# Patient Record
Sex: Male | Born: 1945 | Race: White | Hispanic: No | Marital: Married | Smoking: Former smoker
Health system: Southern US, Community
[De-identification: ages and names within clinical notes are randomized; demographics above are authoritative.]

## PROBLEM LIST (undated history)

## (undated) DIAGNOSIS — I10 Essential (primary) hypertension: Secondary | ICD-10-CM

## (undated) DIAGNOSIS — Z5189 Encounter for other specified aftercare: Secondary | ICD-10-CM

## (undated) DIAGNOSIS — Z0100 Encounter for examination of eyes and vision without abnormal findings: Secondary | ICD-10-CM

## (undated) DIAGNOSIS — H3500 Unspecified background retinopathy: Secondary | ICD-10-CM

## (undated) DIAGNOSIS — E7800 Pure hypercholesterolemia, unspecified: Secondary | ICD-10-CM

## (undated) DIAGNOSIS — M159 Polyosteoarthritis, unspecified: Secondary | ICD-10-CM

## (undated) DIAGNOSIS — E559 Vitamin D deficiency, unspecified: Secondary | ICD-10-CM

## (undated) DIAGNOSIS — E119 Type 2 diabetes mellitus without complications: Secondary | ICD-10-CM

## (undated) DIAGNOSIS — D649 Anemia, unspecified: Secondary | ICD-10-CM

## (undated) DIAGNOSIS — N183 Chronic kidney disease, stage 3 unspecified: Secondary | ICD-10-CM

## (undated) DIAGNOSIS — T8450XA Infection and inflammatory reaction due to unspecified internal joint prosthesis, initial encounter: Secondary | ICD-10-CM

## (undated) DIAGNOSIS — E785 Hyperlipidemia, unspecified: Secondary | ICD-10-CM

## (undated) DIAGNOSIS — M25569 Pain in unspecified knee: Secondary | ICD-10-CM

## (undated) DIAGNOSIS — R011 Cardiac murmur, unspecified: Secondary | ICD-10-CM

## (undated) DIAGNOSIS — I350 Nonrheumatic aortic (valve) stenosis: Secondary | ICD-10-CM

## (undated) DIAGNOSIS — I4891 Unspecified atrial fibrillation: Secondary | ICD-10-CM

## (undated) DIAGNOSIS — I359 Nonrheumatic aortic valve disorder, unspecified: Secondary | ICD-10-CM

## (undated) DIAGNOSIS — Z96659 Presence of unspecified artificial knee joint: Secondary | ICD-10-CM

## (undated) HISTORY — DX: Anemia, unspecified: D64.9

## (undated) HISTORY — DX: Polyosteoarthritis, unspecified: M15.9

## (undated) HISTORY — DX: Unspecified background retinopathy: H35.00

## (undated) HISTORY — DX: Hyperlipidemia, unspecified: E78.5

## (undated) HISTORY — DX: Chronic kidney disease, stage 3 unspecified: N18.30

## (undated) HISTORY — DX: Pain in unspecified knee: M25.569

## (undated) HISTORY — PX: TONSILLECTOMY: SUR1361

## (undated) HISTORY — DX: Encounter for other specified aftercare: Z51.89

## (undated) HISTORY — DX: Encounter for examination of eyes and vision without abnormal findings: Z01.00

## (undated) HISTORY — DX: Pure hypercholesterolemia, unspecified: E78.00

## (undated) HISTORY — DX: Vitamin D deficiency, unspecified: E55.9

## (undated) HISTORY — DX: Unspecified atrial fibrillation: I48.91

## (undated) HISTORY — PX: DENTAL SURGERY: SHX609

## (undated) HISTORY — PX: OTHER SURGICAL HISTORY: SHX169

## (undated) HISTORY — DX: Nonrheumatic aortic valve disorder, unspecified: I35.9

## (undated) HISTORY — DX: Presence of unspecified artificial knee joint: Z96.659

## (undated) HISTORY — PX: TOTAL KNEE ARTHROPLASTY: SHX125

## (undated) HISTORY — DX: Type 2 diabetes mellitus without complications: E11.9

## (undated) HISTORY — DX: Chronic kidney disease, stage 3 (moderate): N18.3

## (undated) HISTORY — DX: Nonrheumatic aortic (valve) stenosis: I35.0

## (undated) HISTORY — DX: Essential (primary) hypertension: I10

## (undated) HISTORY — DX: Infection and inflammatory reaction due to unspecified internal joint prosthesis, initial encounter: T84.50XA

## (undated) HISTORY — DX: Cardiac murmur, unspecified: R01.1

---

## 2005-09-03 ENCOUNTER — Ambulatory Visit: Payer: Self-pay | Admitting: Family Medicine

## 2005-09-12 ENCOUNTER — Ambulatory Visit: Payer: Self-pay | Admitting: Family Medicine

## 2005-09-24 ENCOUNTER — Encounter: Admission: RE | Admit: 2005-09-24 | Discharge: 2005-12-23 | Payer: Self-pay | Admitting: Family Medicine

## 2005-12-11 ENCOUNTER — Ambulatory Visit: Payer: Self-pay | Admitting: Family Medicine

## 2005-12-25 ENCOUNTER — Ambulatory Visit: Payer: Self-pay | Admitting: Family Medicine

## 2006-07-08 ENCOUNTER — Ambulatory Visit: Payer: Self-pay | Admitting: Family Medicine

## 2006-07-17 ENCOUNTER — Ambulatory Visit: Payer: Self-pay | Admitting: Family Medicine

## 2006-10-14 ENCOUNTER — Ambulatory Visit: Payer: Self-pay | Admitting: Family Medicine

## 2006-10-14 LAB — CONVERTED CEMR LAB
Albumin: 3.9 g/dL (ref 3.5–5.2)
Alkaline Phosphatase: 53 units/L (ref 39–117)
BUN: 21 mg/dL (ref 6–23)
Basophils Absolute: 0 10*3/uL (ref 0.0–0.1)
Basophils Relative: 0.9 % (ref 0.0–1.0)
Bilirubin Urine: NEGATIVE
CO2: 29 meq/L (ref 19–32)
Chol/HDL Ratio, serum: 4.2
Cholesterol: 143 mg/dL (ref 0–200)
Creatinine, Ser: 1.1 mg/dL (ref 0.4–1.5)
Glucose, Bld: 126 mg/dL — ABNORMAL HIGH (ref 70–99)
HCT: 42.2 % (ref 39.0–52.0)
Hgb A1c MFr Bld: 6.8 % — ABNORMAL HIGH (ref 4.6–6.0)
Ketones, ur: NEGATIVE mg/dL
MCV: 85.2 fL (ref 78.0–100.0)
Monocytes Absolute: 0.6 10*3/uL (ref 0.2–0.7)
PSA: 1.38 ng/mL (ref 0.10–4.00)
Potassium: 4 meq/L (ref 3.5–5.1)
RBC: 4.95 M/uL (ref 4.22–5.81)
Sodium: 140 meq/L (ref 135–145)
TSH: 1.26 microintl units/mL (ref 0.35–5.50)
Total Protein, Urine: NEGATIVE mg/dL
Total Protein: 6.8 g/dL (ref 6.0–8.3)
Triglyceride fasting, serum: 87 mg/dL (ref 0–149)
VLDL: 17 mg/dL (ref 0–40)

## 2006-10-21 ENCOUNTER — Ambulatory Visit: Payer: Self-pay | Admitting: Family Medicine

## 2006-10-29 ENCOUNTER — Ambulatory Visit: Payer: Self-pay | Admitting: Family Medicine

## 2006-10-29 ENCOUNTER — Encounter: Payer: Self-pay | Admitting: Cardiology

## 2007-02-05 ENCOUNTER — Inpatient Hospital Stay (HOSPITAL_COMMUNITY): Admission: RE | Admit: 2007-02-05 | Discharge: 2007-02-07 | Payer: Self-pay | Admitting: Orthopedic Surgery

## 2007-07-29 ENCOUNTER — Ambulatory Visit: Payer: Self-pay | Admitting: Family Medicine

## 2007-07-29 ENCOUNTER — Ambulatory Visit: Payer: Self-pay | Admitting: Internal Medicine

## 2007-07-29 DIAGNOSIS — E119 Type 2 diabetes mellitus without complications: Secondary | ICD-10-CM | POA: Insufficient documentation

## 2007-07-29 DIAGNOSIS — M1990 Unspecified osteoarthritis, unspecified site: Secondary | ICD-10-CM | POA: Insufficient documentation

## 2007-07-29 DIAGNOSIS — M25569 Pain in unspecified knee: Secondary | ICD-10-CM | POA: Insufficient documentation

## 2007-07-29 DIAGNOSIS — J209 Acute bronchitis, unspecified: Secondary | ICD-10-CM | POA: Insufficient documentation

## 2007-07-29 DIAGNOSIS — I10 Essential (primary) hypertension: Secondary | ICD-10-CM | POA: Insufficient documentation

## 2007-07-30 ENCOUNTER — Inpatient Hospital Stay (HOSPITAL_COMMUNITY): Admission: RE | Admit: 2007-07-30 | Discharge: 2007-08-04 | Payer: Self-pay | Admitting: Orthopedic Surgery

## 2007-07-31 ENCOUNTER — Encounter: Payer: Self-pay | Admitting: Internal Medicine

## 2007-08-18 ENCOUNTER — Telehealth: Payer: Self-pay | Admitting: Family Medicine

## 2007-08-26 ENCOUNTER — Encounter: Payer: Self-pay | Admitting: Internal Medicine

## 2007-08-26 DIAGNOSIS — M159 Polyosteoarthritis, unspecified: Secondary | ICD-10-CM | POA: Insufficient documentation

## 2007-08-26 DIAGNOSIS — E785 Hyperlipidemia, unspecified: Secondary | ICD-10-CM | POA: Insufficient documentation

## 2007-08-26 DIAGNOSIS — I359 Nonrheumatic aortic valve disorder, unspecified: Secondary | ICD-10-CM | POA: Insufficient documentation

## 2007-08-27 ENCOUNTER — Ambulatory Visit: Payer: Self-pay | Admitting: Internal Medicine

## 2007-08-27 DIAGNOSIS — T8450XA Infection and inflammatory reaction due to unspecified internal joint prosthesis, initial encounter: Secondary | ICD-10-CM | POA: Insufficient documentation

## 2007-10-03 ENCOUNTER — Ambulatory Visit: Payer: Self-pay | Admitting: Family Medicine

## 2007-10-14 ENCOUNTER — Ambulatory Visit: Payer: Self-pay | Admitting: Internal Medicine

## 2007-11-11 ENCOUNTER — Ambulatory Visit: Payer: Self-pay | Admitting: Family Medicine

## 2007-11-11 LAB — CONVERTED CEMR LAB
Albumin: 4.2 g/dL (ref 3.5–5.2)
Alkaline Phosphatase: 49 units/L (ref 39–117)
Basophils Relative: 0.3 % (ref 0.0–1.0)
Bilirubin, Direct: 0.1 mg/dL (ref 0.0–0.3)
CO2: 27 meq/L (ref 19–32)
Creatinine,U: 158.2 mg/dL
Eosinophils Absolute: 0.1 10*3/uL (ref 0.0–0.6)
GFR calc Af Amer: 98 mL/min
GFR calc non Af Amer: 81 mL/min
Glucose, Bld: 128 mg/dL — ABNORMAL HIGH (ref 70–99)
HDL: 32.7 mg/dL — ABNORMAL LOW (ref >39.0)
Hemoglobin: 13.2 g/dL (ref 13.0–17.0)
Lymphocytes Relative: 28.1 % (ref 12.0–46.0)
MCV: 84.3 fL (ref 78.0–100.0)
Microalb, Ur: 1 mg/dL (ref 0.0–1.9)
Monocytes Relative: 12.9 % — ABNORMAL HIGH (ref 3.0–11.0)
Neutro Abs: 2.8 10*3/uL (ref 1.4–7.7)
Neutrophils Relative %: 56.1 % (ref 43.0–77.0)
PSA: 1.71 ng/mL (ref 0.10–4.00)
Potassium: 4.6 meq/L (ref 3.5–5.1)
RDW: 15.2 % — ABNORMAL HIGH (ref 11.5–14.6)
TSH: 1.2 microintl units/mL (ref 0.35–5.50)
Total Protein: 6.9 g/dL (ref 6.0–8.3)
Triglycerides: 80 mg/dL (ref 0–149)
VLDL: 16 mg/dL (ref 0–40)

## 2007-11-18 ENCOUNTER — Ambulatory Visit: Payer: Self-pay | Admitting: Family Medicine

## 2008-01-01 ENCOUNTER — Telehealth: Payer: Self-pay | Admitting: Family Medicine

## 2008-02-24 ENCOUNTER — Inpatient Hospital Stay (HOSPITAL_COMMUNITY): Admission: RE | Admit: 2008-02-24 | Discharge: 2008-03-01 | Payer: Self-pay | Admitting: Orthopedic Surgery

## 2008-02-25 ENCOUNTER — Ambulatory Visit: Payer: Self-pay | Admitting: Infectious Diseases

## 2008-03-17 IMAGING — CR DG CHEST 1V PORT
1 series · 1 of 1 positions shown · non-contrast
Comparison: 02/03/07.

CLINICAL DATA: PICC placement.
 PORTABLE CHEST - 1 VIEW:

[view not recorded]
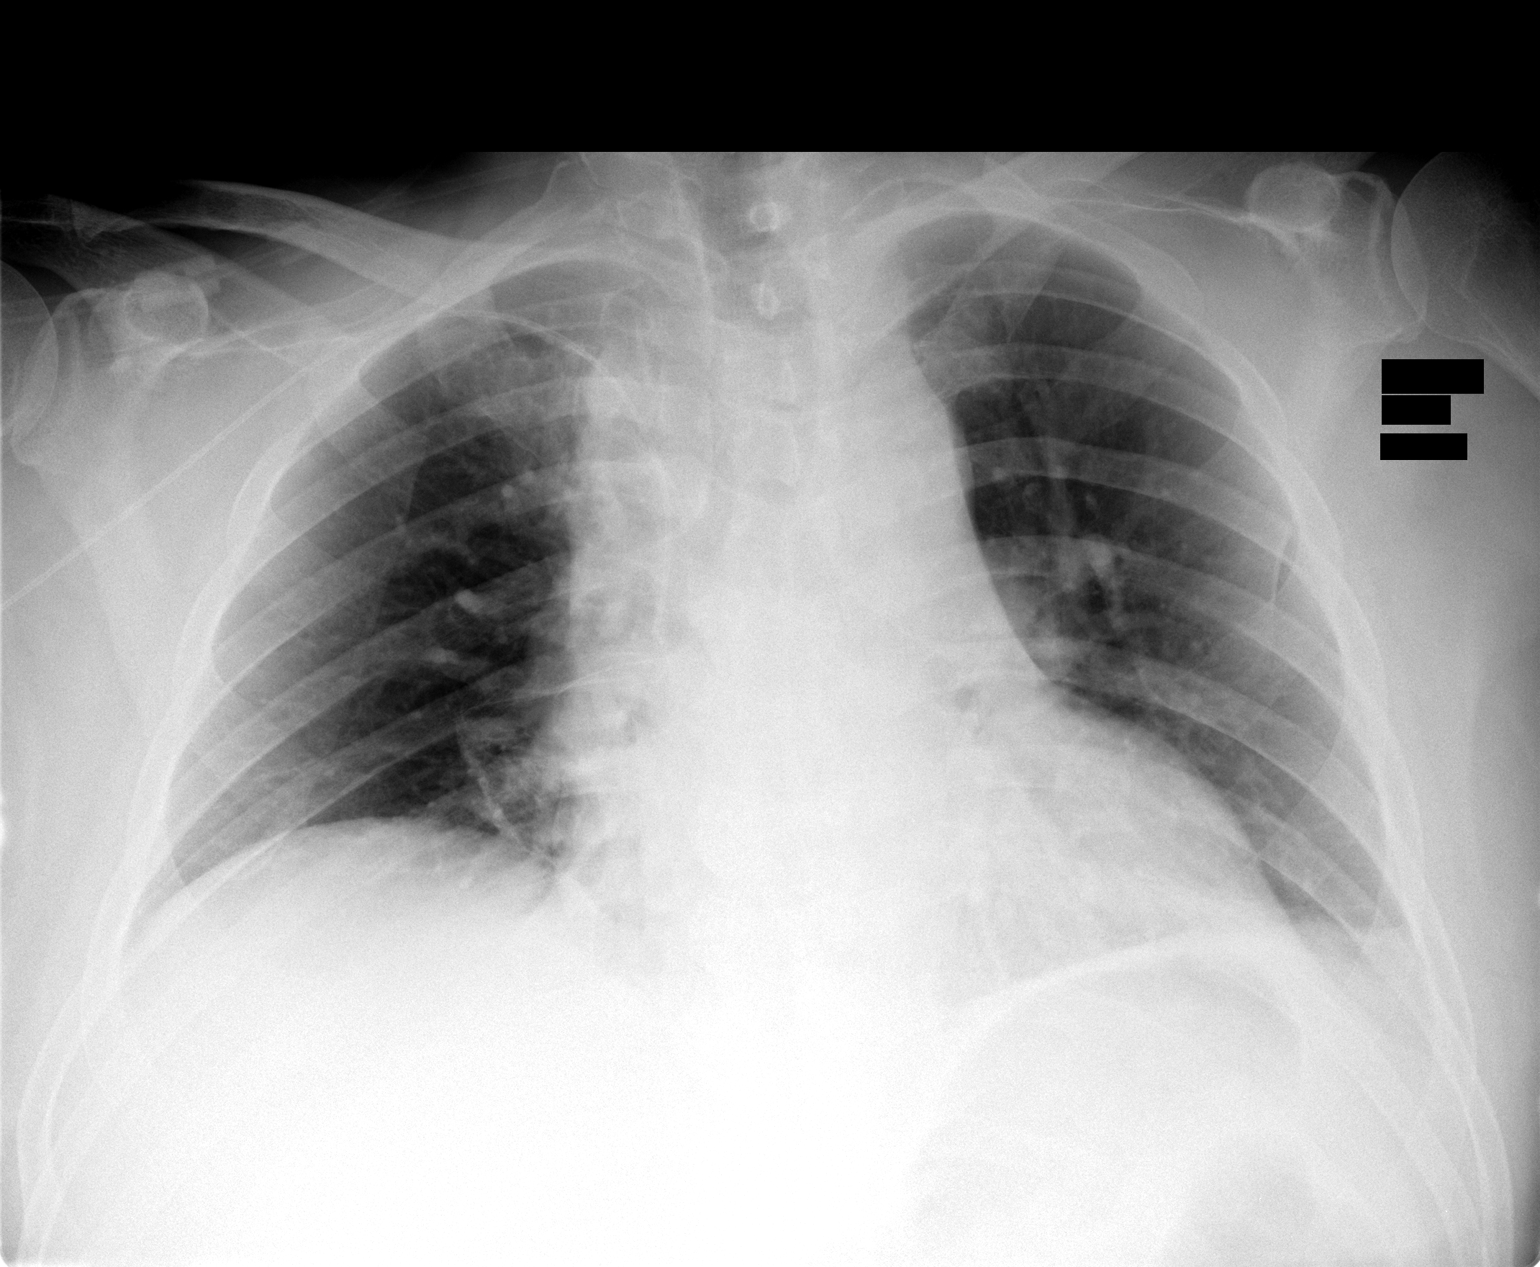

[1 of 1 positions shown; findings below may reference images not displayed]

FINDINGS: Suboptimal level of inspiration.  The heart appears somewhat enlarged, but this may be an artifact of low inspiration plus AP projection.
 A right upper extremity PICC has been placed.  Its tip is in the mid SVC.  It is estimated to be about 2 ? cm above the cavoatrial junction.
IMPRESSION: 1.  Low level of inspiration.
 2.  Possible cardiomegaly.
 3.  Right upper extremity PICC estimated to be in the mid SVC, about 2.5 cm above the cavoatrial junction.

## 2008-04-05 ENCOUNTER — Encounter: Payer: Self-pay | Admitting: Internal Medicine

## 2008-04-06 ENCOUNTER — Ambulatory Visit: Payer: Self-pay | Admitting: Internal Medicine

## 2008-04-06 DIAGNOSIS — D649 Anemia, unspecified: Secondary | ICD-10-CM | POA: Insufficient documentation

## 2008-04-16 ENCOUNTER — Ambulatory Visit: Payer: Self-pay | Admitting: Family Medicine

## 2008-04-21 LAB — CONVERTED CEMR LAB
ALT: 16 units/L (ref 0–53)
Albumin: 4.2 g/dL (ref 3.5–5.2)
BUN: 19 mg/dL (ref 6–23)
Bilirubin, Direct: 0.1 mg/dL (ref 0.0–0.3)
Creatinine, Ser: 1.1 mg/dL (ref 0.4–1.5)
Eosinophils Absolute: 0.1 10*3/uL (ref 0.0–0.7)
GFR calc Af Amer: 88 mL/min
GFR calc non Af Amer: 72 mL/min
HDL: 33.1 mg/dL — ABNORMAL LOW (ref >39.0)
Hemoglobin: 12.5 g/dL — ABNORMAL LOW (ref 13.0–17.0)
LDL Cholesterol: 83 mg/dL (ref 0–99)
MCHC: 33.8 g/dL (ref 30.0–36.0)
Monocytes Relative: 10.7 % (ref 3.0–12.0)
Neutro Abs: 2.5 10*3/uL (ref 1.4–7.7)
Platelets: 292 10*3/uL (ref 150–400)
Potassium: 4.7 meq/L (ref 3.5–5.1)
TSH: 0.78 microintl units/mL (ref 0.35–5.50)
Total Protein: 7.2 g/dL (ref 6.0–8.3)
Triglycerides: 108 mg/dL (ref 0–149)

## 2008-04-23 ENCOUNTER — Telehealth (INDEPENDENT_AMBULATORY_CARE_PROVIDER_SITE_OTHER): Payer: Self-pay | Admitting: *Deleted

## 2008-05-04 ENCOUNTER — Inpatient Hospital Stay (HOSPITAL_COMMUNITY): Admission: RE | Admit: 2008-05-04 | Discharge: 2008-05-07 | Payer: Self-pay | Admitting: Orthopedic Surgery

## 2008-05-07 ENCOUNTER — Encounter: Payer: Self-pay | Admitting: Family Medicine

## 2008-07-09 ENCOUNTER — Ambulatory Visit: Payer: Self-pay | Admitting: Family Medicine

## 2008-07-19 LAB — CONVERTED CEMR LAB
Basophils Absolute: 0.1 10*3/uL (ref 0.0–0.1)
Basophils Relative: 1 % (ref 0–1)
Lymphocytes Relative: 24 % (ref 12–46)
MCV: 86.1 fL (ref 78.0–100.0)
Monocytes Absolute: 0.6 10*3/uL (ref 0.1–1.0)
Neutrophils Relative %: 63 % (ref 43–77)

## 2008-10-05 ENCOUNTER — Ambulatory Visit: Payer: Self-pay | Admitting: Family Medicine

## 2008-10-25 ENCOUNTER — Ambulatory Visit: Payer: Self-pay | Admitting: Family Medicine

## 2008-10-25 LAB — CONVERTED CEMR LAB
Blood in Urine, dipstick: NEGATIVE
Protein, U semiquant: NEGATIVE
Specific Gravity, Urine: 1.025
Urobilinogen, UA: 0.2
WBC Urine, dipstick: NEGATIVE
pH: 5.5

## 2008-10-26 LAB — CONVERTED CEMR LAB
ALT: 21 units/L (ref 0–53)
Albumin: 3.9 g/dL (ref 3.5–5.2)
Alkaline Phosphatase: 53 units/L (ref 39–117)
Basophils Absolute: 0.1 10*3/uL (ref 0.0–0.1)
Bilirubin, Direct: 0.2 mg/dL (ref 0.0–0.3)
Calcium: 9.1 mg/dL (ref 8.4–10.5)
Cholesterol: 143 mg/dL (ref 0–200)
Eosinophils Relative: 3.3 % (ref 0.0–5.0)
GFR calc Af Amer: 79 mL/min
Glucose, Bld: 153 mg/dL — ABNORMAL HIGH (ref 70–99)
HCT: 37 % — ABNORMAL LOW (ref 39.0–52.0)
HDL: 33.8 mg/dL — ABNORMAL LOW (ref >39.0)
Hgb A1c MFr Bld: 6.6 % — ABNORMAL HIGH (ref 4.6–6.0)
Lymphocytes Relative: 25.7 % (ref 12.0–46.0)
Microalb Creat Ratio: 6.9 mg/g (ref 0.0–30.0)
Neutro Abs: 3.3 10*3/uL (ref 1.4–7.7)
Platelets: 217 10*3/uL (ref 150–400)
Total Bilirubin: 0.9 mg/dL (ref 0.3–1.2)
Total CHOL/HDL Ratio: 4.2
Total Protein: 6.8 g/dL (ref 6.0–8.3)
Triglycerides: 136 mg/dL (ref 0–149)
VLDL: 27 mg/dL (ref 0–40)
WBC: 5.5 10*3/uL (ref 4.5–10.5)

## 2008-10-29 LAB — CONVERTED CEMR LAB: Vit D, 1,25-Dihydroxy: 30 (ref 30–89)

## 2008-11-04 ENCOUNTER — Ambulatory Visit: Payer: Self-pay | Admitting: Family Medicine

## 2008-11-08 ENCOUNTER — Inpatient Hospital Stay (HOSPITAL_COMMUNITY): Admission: RE | Admit: 2008-11-08 | Discharge: 2008-11-10 | Payer: Self-pay | Admitting: Orthopedic Surgery

## 2008-11-10 ENCOUNTER — Encounter: Payer: Self-pay | Admitting: Family Medicine

## 2008-11-15 ENCOUNTER — Telehealth: Payer: Self-pay | Admitting: Family Medicine

## 2009-03-02 ENCOUNTER — Ambulatory Visit: Payer: Self-pay | Admitting: Family Medicine

## 2009-03-07 LAB — CONVERTED CEMR LAB
AST: 18 units/L (ref 0–37)
Albumin: 3.7 g/dL (ref 3.5–5.2)
Alkaline Phosphatase: 51 units/L (ref 39–117)
Bilirubin, Direct: 0.2 mg/dL (ref 0.0–0.3)
Cholesterol: 152 mg/dL (ref 0–200)
LDL Cholesterol: 83 mg/dL (ref 0–99)
Total Bilirubin: 1 mg/dL (ref 0.3–1.2)
Total Protein: 6.5 g/dL (ref 6.0–8.3)
VLDL: 36 mg/dL (ref 0–40)

## 2009-03-09 ENCOUNTER — Ambulatory Visit: Payer: Self-pay | Admitting: Family Medicine

## 2009-03-09 DIAGNOSIS — E559 Vitamin D deficiency, unspecified: Secondary | ICD-10-CM | POA: Insufficient documentation

## 2009-04-12 ENCOUNTER — Telehealth: Payer: Self-pay | Admitting: Family Medicine

## 2009-10-13 ENCOUNTER — Encounter: Payer: Self-pay | Admitting: Family Medicine

## 2009-12-13 ENCOUNTER — Encounter: Admission: RE | Admit: 2009-12-13 | Discharge: 2009-12-13 | Payer: Self-pay | Admitting: Family Medicine

## 2011-05-08 NOTE — H&P (Signed)
NAME:  MAYSEN, SUDOL              ACCOUNT NO.:  [id number]   MEDICAL RECORD NO.:  [id number]          PATIENT TYPE:  INP   LOCATION:  NA                           FACILITY:  [facility]   PHYSICIAN:  Madlyn Frankel. Charlann Boxer, M.D.  DATE OF BIRTH:  September 01, 1946   DATE OF ADMISSION:  05/04/2008  DATE OF DISCHARGE:                              HISTORY & PHYSICAL   REASON FOR ADMISSION:  Reimplantation of right total knee arthroplasty  by surgeon Dr. Durene Romans.   CHIEF COMPLAINT AND REASON FOR ADMISSION:  Resection of a right total  knee, with antibiotic spacer.   HISTORY OF PRESENT ILLNESS:  A 65 year old male with a history of right  total knee replacement in February 2008, with subsequent infection and  resection 8 weeks ago, with treatment with antibiotics.  Since then, he  has had no further fever or complications.  He has had three previous  surgeries and has had his greatest pain control after first surgery,  where anesthesia utilized was spinal plus with femoral nerve block.  He  has continue to monitor his temperature and has had no further fevers  since approximately 5 weeks ago.   PAST SURGICAL HISTORY:  1. Right knee replacement in February 2008.  2. Polyethylene exchange of a right total knee in August 2008.  3. Resection of a right total knee in March 2009.  4. Left knee arthroscopic surgery in 1964 and 1966.   FAMILY MEDICAL HISTORY:  Heart disease, diabetes, cancer.   SOCIAL HISTORY:  [name], primary caregiver.  He is a nonsmoker.   DRUG ALLERGIES:  NO KNOWN DRUG ALLERGIES.   MEDICATIONS:  1. Celebrex 200 mg 1 p.o. b.i.d.  2. Cozaar 50 mg 1 p.o. daily.  3. Simvastatin 40 mg 1 p.o. daily.  4. Avandamet 2/500, 1 p.o. daily.  5. Iron 65 mg 1 p.o. b.i.d.  6. Centrum Silver multivitamin p.o. daily.  7. Low-dose aspirin 81 mg 1 p.o. daily.   REVIEW OF SYSTEMS:  See HPI.   PHYSICAL EXAMINATION:  VITAL SIGNS:  Pulse 64, respirations 18, blood  pressure 124/68.  GENERAL:   Awake, alert and oriented, well developed, well nourished, in  no acute distress.  NECK:  Supple.  No carotid bruits.  CHEST:  Lungs clear to auscultation bilaterally.  BREASTS:  Deferred.  HEART:  Regular rate and rhythm.  S1, S2 distinct.  ABDOMEN:  Soft, nontender, nondistended.  Bowel sounds present.  GENITOURINARY:  Deferred.  EXTREMITIES:  Right lower extremity is in a knee immobilizer.  Knee is  not warm to the touch.  No sign of infection.  SKIN:  No cellulitis.  NEUROLOGIC:  Intact distal sensibilities.   LABORATORIES:  Drawn on April 16, 2008 show hemoglobin 12.5, hematocrit  36.9, platelets at 292.  Basic metabolic panel showed sodium 141,  potassium 4.7, BUN 19, creatinine 1.1, glucose 123.  Hepatic panel all  within normal limits.  His A1c was 5.6%.  His fasting TSH was 0.78.  Of  note, he had a lipid profile done.  His HDL was 33.1, his LDL was 83.   Chest  x-ray and EKGs:  Had both these performed and done in the past 6  months.  Should be part of previous medical records.   IMPRESSION:  Resection of a right total knee, with antibiotic spacer.   PLAN OF ACTION:  Reimplantation of a right total knee arthroplasty at  [facility] on May 04, 2008 by surgeon Dr. Durene Romans.  Questions were encouraged, answered, and reviewed.   He does have postoperative medications which were provided of history  and physical which included Lovenox.  Pain medicines will be provided at  the time of surgery.   PAIN CONTROL:  Will require the use of oral medications.  The patient is  very adamant that he does not want PCA of any sort utilized.  Of note,  as stated in the HPI, he would prefer to have a spinal plus a femoral  nerve block, as that has provided the greatest analgesia in the past  without to need for IV narcotic medications.  His plan is to go home  after surgery as well and is looking forward to walking.     ______________________________  Yetta Glassman. [address],  [place]      Madlyn Frankel. Charlann Boxer, M.D.  Electronically Signed    BLM/MEDQ  D:  04/23/2008  T:  04/23/2008  Job:  [id number]   cc:   Jeannett Senior A. Clent Ridges, MD  [address] [place]  [place] [address]

## 2011-05-08 NOTE — Op Note (Signed)
NAME:  [name], [name]              ACCOUNT NO.:  [id number]   MEDICAL RECORD NO.:  [id number]          PATIENT TYPE:  INP   LOCATION:  1601                         FACILITY:  [facility]   PHYSICIAN:  Madlyn Frankel. Charlann Boxer, M.D.  DATE OF BIRTH:  [birth year]   DATE OF PROCEDURE:  07/30/2007  DATE OF DISCHARGE:                               OPERATIVE REPORT   PREOPERATIVE DIAGNOSIS:  Infected right total knee.   POSTOPERATIVE DIAGNOSIS:  Infected right total knee.   PROCEDURE:  Right knee incision and debridement of right total knee,  with polyethylene exchange size 5 x 10 mm posterior stabilized insert.   SURGEON:  Madlyn Frankel. Charlann Boxer, M.D.   ASSISTANT:  Yetta Glassman. Mann, PA.   SPECIMENS SENT:  Joint fluid for Gram's stain and culture, anaerobic,  aerobic.   DRAINS:  x1.   TOURNIQUET TIME:  70 minutes at 250 mmHg.   COMPLICATIONS:  None.   INDICATIONS:  [name] is a very pleasant 65 year old gentleman who  is now about 6 months out from a right total knee replacement index  procedure.  He had done extremely well throughout his initial  postoperative period, with no problems or concerns for infection.  His  wound healed without difficulty or drainage.   He was in his normal state of health until a week ago.  He had a minor  injury to his knee.  He had noted at that time the onset of some  swelling to his knee.  His wife also reported a fever up to 102.  He was  seen and evaluated in the office on Monday, and aspiration performed  revealing abundant white blood cells, with no culture grown to date.  Based on the persistent swelling, warmth, low-grade fevers, and these  aspiration values, with no white cell count available, I talked with the  patient about treatment options.  Given the fact that he was pain free  for 6 months and had new onset pain, I felt the most prudent course of  action was an open I&D, polyethylene exchange.  I did not want to wait  for cultures or lab work with the  presumptive clinic diagnosis of  infection.  Risks and benefits of this type procedures well as the  potential risk that this would not succeed and he would need resection  of this joint and treatment in a two-stage technique were all discussed.  Consent obtained.   PROCEDURE IN DETAIL:  The patient was brought to the operative theater.  Once adequate anesthesia was established, the patient was positioned  supine.  A proximal thigh tourniquet was placed.  The right lower  extremity was clipped around the incision, then pre-scrubbed and prepped  and draped in a sterile fashion.  I excised this previous scar and  created small skin flaps.  A median parapatellar arthrotomy was carried  out.  At this point, an extensive synovectomy and debridement of scar in  the medial lateral gutters was carried out.  This helped with exposure  as well as performing this large synovectomy.  The complete medial,  lateral, and superior patellar  regions were all debrided.  The knee was  then brought to flexion.  With the knee exposed, I used an osteotome to  amputate the post of this rotating platform knee system.  The  polyethylene was removed without difficulty.   At this point, I debrided the posterior aspect of the knee.  Following  this debridement, 9 liters of normal saline solution was pulse lavaged  into his knee.  Following the pulse lavage and further debridements as  necessary, the final size 5 x 10 insert was replaced.   I subsequently re-irrigated about 500 cc of fluid, and then another 500  cc of bacitracin-laden fluid.  A medium Hemovac drain was placed deep.  The extensor mechanism was reapproximated using #1 PDS; 2-0 Vicryl was  used sparingly in the subcutaneous layer, and a 2-0 nylon on the skin.  The knee was cleaned, dried, and dressed sterilely with Xeroform. and a  sterile bulky wrap.  The tourniquet was let down at 70 minutes.  The  patient was brought to the recovery room in stable  condition.      Madlyn Frankel Charlann Boxer, M.D.  Electronically Signed     MDO/MEDQ  D:  07/30/2007  T:  07/31/2007  Job:  [id number]

## 2011-05-08 NOTE — Op Note (Signed)
NAME:  [name], [name]              ACCOUNT NO.:  [id number]   MEDICAL RECORD NO.:  [id number]          PATIENT TYPE:  INP   LOCATION:  0003                         FACILITY:  [facility]   PHYSICIAN:  James Frankel. Charlann [name], M.D.  DATE OF BIRTH:  1946/03/25   DATE OF PROCEDURE:  02/24/2008  DATE OF DISCHARGE:                               OPERATIVE REPORT   PREOPERATIVE DIAGNOSIS:  Infected right total knee replacement.   POSTOPERATIVE DIAGNOSIS:  Infected right total knee replacement.   PROCEDURE:  Resection right total knee replacement with placement of an  antibiotic and laden spacer using 1 gram of vancomycin and a vial of 3.2  grams of tobramycin per bag of cement and three bags of cement utilized.   SURGEON:  James Frankel. Charlann [name], M.D.   ASSISTANT:  James Luo, PA-C.   ANESTHESIA:  General plus preoperatively placed femoral nerve block.   ESTIMATED BLOOD LOSS:  750 mL after the tourniquet was let down at 124  minutes at 250 mmHg.   DRAINS:  Drains x1.   COMPLICATIONS:  None.   INDICATIONS FOR THE PROCEDURE:  [name] is a very pleasant 65-year-  old gentleman patient of mine who had an index procedure right total  knee replacement over a year ago. Approximately 4 months ago, he had  some swelling, taken to the operating room for I&D and polyethylene  exchange at the time the bacteria grew out but he was treated with IV  antibiotics for 6 weeks. There weeks ago he had come off his oral  antibiotics and developed fevers, chills and swelling. His knee was  aspirated on Friday and indicated over 90,000 white cells. He was set up  for a surgery procedure at the time of my office evaluation due to the  swelling and history and knowing that this knee needed to come out. I  reviewed with him the current operation in terms of resection  arthroplasty, the risks, benefits and the fact that he will be back on  IV antibiotics with a PICC line and IV antibiotics for 6 weeks followed  by a period  of 2-3 weeks off antibiotics and then replacement of total  knee provided there are no signs of infection.   Questions were encouraged and answered regarding this procedure and the  course and consent was obtained.   PROCEDURE IN DETAIL:  The patient was brought to the operative theater.  Once adequate anesthesia was administered, I had given vancomycin  perioperatively due to the fact we had already aspirated.  The right  lower extremity was then prescrubbed and prepped and draped in sterile  fashion over a nonsterile thigh tourniquet.   The patient's previous incision was utilized, extended a little bit  above and below.  A median arthrotomy was created and the large infected  infusion septic joint was identified and cultures obtained.  Following  initial debridement and the large synovectomy, attention was now  directed to removal of the prosthesis.  I used a small ACL type  oscillating saw not to undermined the cement bone interface.  Both on  the tibia and  the femur.  After I had used the oscillating saw on the  tibia, I was able to elevate it with a osteotome but was unable to clear  the distal portion of the lateral femur. I now attended to the femoral  portion using the oscillating saw. I used this first followed by the use  of osteotomes and subsequently was able to remove the femoral component  without significant bone loss. I then was able to remove the tibial  component. I used a combination of drill, osteotomes and rongeurs to  remove the remaining cement.  I then debrided around the patella and was  able to use an oscillating saw to remove the patella button. It removed  the plastic piece as well as cement from this area. At this point, I  irrigated the knee first with a standard pulse lavage at 3 liters and  then I used a canal brush to irrigate the femoral canal.  I used a  combined total of approximately 11 liters of normal saline solution  pulse lavage. Once the  components were out and the knee was irrigated, I  had mixed three batches of cement with Vancomycin and tobramycin per bag  of cement. I molded this into a flat mold that was approximately 32 mm  in diameter to fit between the femur and the tibia.  I had also created  a flat shield that was going to go in the suprapatellar pouch area. Once  the cement had cured and the knee had been irrigated,  I placed this  block a cement between the femur and tibia. One brief irrigation was  carried out.  At this point, I used a #1 PDS and reapproximated the  extensor mechanism over a medium Hemovac drain. I then used #1 Vicryl on  the subcu layer and 2-0 nylon on the skin.  The tourniquet was let down  at 184 minutes at 250 mmHg. The knee was then cleaned, dried and dressed  sterilely with Xeroform and a bulky sterile wrap.  He was brought to the  recovery room in stable condition and extubated.      [name], M.D.  Electronically Signed     MDO/MEDQ  D:  02/24/2008  T:  02/24/2008  Job:  [id number]

## 2011-05-08 NOTE — Op Note (Signed)
NAME:  [name], [name]              ACCOUNT NO.:  [id number]   MEDICAL RECORD NO.:  [id number]          PATIENT TYPE:  INP   LOCATION:  0002                         FACILITY:  [facility]   PHYSICIAN:  [name], M.D.  DATE OF BIRTH:  May 31, 1946   DATE OF PROCEDURE:  11/08/2008  DATE OF DISCHARGE:                               OPERATIVE REPORT   PREOPERATIVE DIAGNOSIS:  Left knee, osteoarthritis.   POSTOPERATIVE DIAGNOSIS:  Left knee, osteoarthritis.   PROCEDURE:  Left total knee replacement.   COMPONENTS USED:  DePuy rotating platform posterior stabilized knee  system, size 5 femur, 5 tibia, 12.5 insert and a 41 patellar button.   SURGEON:  [name], M.D.   ASSISTANT:  James Luo, PA-C   ANESTHESIA:  A regional femoral nerve block plus spinal block.   SPECIMEN:  None.   BLOOD LOSS:  Minimal.   COMPLICATIONS:  None.   TOURNIQUET TIME:  55 minutes at 250 mmHg.   DRAINS:  One medium Hemovac.   INDICATIONS FOR PROCEDURE:  [name] is a patient well known to me  for bilateral knee osteoarthritis.  He had undergone right total knee  replacement, unfortunately complicated by infection, successfully  underwent a two-stage reimplantation.  Now that he has made it through  that part of it, he is here for his scheduled left total knee.  We  reviewed the risks and benefits, he is well aware of them based on his  past experienced.  Consent was obtained for the benefit of pain relief.   PROCEDURE IN DETAIL:  The patient was brought to the operative theater.  Once adequate anesthesia, preoperative antibiotics, Ancef 2 grams  administered, the patient was positioned supine with a thigh tourniquet  placed.  The left lower extremity was then pre scrubbed, prepped and  draped in sterile fashion.   Leg was exsanguinated, tourniquet elevated.  A paramidline incision was  made based on previous incisions.  He had one medially and one  laterally.  Sharp dissection was carried down  to the extensor mechanism.  The median arthrotomy was created following initial debridement and I  was able to work on the patella first.  Precut measurement was 26-27 mm.  I resected down to 14 mm and used a 41 patellar button.   With the patella button in place, it restore the patella height back to  26 mm.   I then used an osteotome at this point to debride some of the  osteophytes out of the notch of the femur.  I resected the PCL as the  ACL was already deficient.   The femoral canal was opened with a drill, irrigated to prevent fat  emboli.  I then passed an intramedullary rod at 5 degrees valgus, I  resected 10 mm of bone off the distal femur.  This amounted to only  about a millimeter cut on the lateral femur.  Nonetheless, the cut was  flush.   After this distal femoral cut, attention was now directed to the tibia.  The tibia was subluxated anteriorly.  An extramedullary guide was used  passed  along the shaft the tibia in a perpendicular plane.  I resected 2  mm of bone off the lateral side, the most affected side.   Following this resection, I checked with my extension block and was  happy the knee was coming out to extension with 10 mm block.  The pins  were removed.  Attention was now redirected to femur.  The patient was  noted have hypoplastic lateral femoral condyle.  I based my rotation on  the proximal tibial cut.  I checked this cut with the size 5 tray and  made sure the cut was perpendicular in both planes and once it was, we  set the rotation of the femoral cutting block based on this.   The rotational block was pinned into position.  When I checked this in  relation to [facility], I was concerned it was still slightly bit  internally rotated and due to the valgus nature of the knee as it was, I  went ahead and I dropped the lateral pin down about a pin diameter down.  This helped to set the rotation more perpendicular to [employer].   The 4-in-1  cutting block was then pinned.  I checked with a cutting  block angel wing and actually dropped the block down one group of holes.  The anterior, posterior and chamfer cuts then made.  There was no  notching.  By dropping down further it helped tighten up my flexion gap.   Following the removal of bone, there was noted be very little bone  removed off the posterior chamfer, but very sclerotic bone surface.   The box cut was made off the lateral aspect of distal femur for the size  5.  I then drilled holes in the sclerotic portion of the lateral femoral  condyle to allow for cement interdigitation.   Attention now redirected to the tibia.  Tibia was subluxated anteriorly.  The size 5 tibial tray was placed on the cut surface, pinned into  position.  I checked again with alignment rod and was happy with the  location drilled and keel punched in this location.  Trial reduction was  now carried out and I used a 12.5 insert first.  The knee came to full  extension, was stable from extension to flexion.  The patella tracked  without application of pressure.  Given all these parameters, the trial  components were removed.  The final components were opened.  The capsule  with synovial junction was injected with 0.25% Marcaine with epinephrine  total of 60 mL and 1 mL of 30 mg of Toradol.  Cement was mixed as the  knee was being prepared after irrigation and drying.  The final  components were cemented in position with the knee brought out to  extension and allowed the cement to cure.   Please note that I did mix based on this high risk nature of previous  infection and diabetes, a gram of vancomycin per bag of cement.  Once  the cement had cured, excessive cement was removed throughout the knee.  Once I was satisfied I was unable to visualize any more cement, I  irrigated the knee with a bulb syringe and then placed the final 12.5  insert.  At this point I pulse lavaged the knee.  The  tourniquet was let  down at 55 minutes.  Medium Hemovac drain was placed deep.  We  reapproximated the extensor mechanism with #1 Vicryl with the knee in  flexion.  The  remainder of the wound was closed in layers with 2-0 Vicryl and running  4-0 Monocryl.  The knee was cleaned, dried and dressed sterilely with a  bulky sterile wrap.  He was brought to recovery room in stable  condition, tolerating the procedure well.      James Frankel Charlann [name], M.D.  Electronically Signed     MDO/MEDQ  D:  11/08/2008  T:  11/09/2008  Job:  [id number]

## 2011-05-08 NOTE — H&P (Signed)
NAME:  [name], [name]              ACCOUNT NO.:  [id number]   MEDICAL RECORD NO.:  [id number]          PATIENT TYPE:  INP   LOCATION:  1606                         FACILITY:  [facility]   PHYSICIAN:  James Frankel. [name], M.D.  DATE OF BIRTH:  08/28/1946   DATE OF ADMISSION:  02/24/2008  DATE OF DISCHARGE:  03/01/2008                              HISTORY & PHYSICAL   REASON FOR ADMISSION:  Infected right total knee.   CHIEF COMPLAINTS:  Right knee pain.   HISTORY OF PRESENT ILLNESS:  [name] is a 65 year old gentleman with  a history of right total knee replacement approximately 9 months ago. He  presented to our office having some fevers, chills and painful knee.  He  also had a history of I&D and polyethylene exchange in the past as well.  When we aspirated his knee in the office, we found white cells.  We then  plan to admit him to the hospital for resection of the right total knee  with long-term antibiotic administration with subsequent reimplantation  afterwards provided there is no infection.   PAST MEDICAL HISTORY:  1. Osteoarthritis.  2. Hypertension.  3. Diabetes type 2.   PAST SURGICAL HISTORY:  1. Right total knee arthroplasty February 2008.  2. Left knee arthroscopic surgery in 1964 and 66.   FAMILY HISTORY:  Heart disease, diabetes, cancer.   SOCIAL HISTORY:  Wife [name] primary caregiver, nonsmoker.   DRUG ALLERGIES:  No known drug allergies.   MEDICATIONS:  1. Avandamet 2/500 one p.o. b.i.d.  2. Simvastatin 20 mg one p.o. daily.  3. Cozaar 50 mg one p.o. daily.  4. Aspirin 81 mg one p.o. daily.  5. Multivitamin p.o. daily.  6. Celebrex 200 mg one p.o. daily p.r.n.   REVIEW OF SYSTEMS:  See HPI.   PHYSICAL EXAMINATION:  Pulse 87, respirations 20, blood pressure 144/79.  GENERAL:  Awake, alert and oriented, well-developed, well-nourished.  NECK:  Supple.  No carotid bruits.  CHEST:  Lungs clear to auscultation bilaterally.  BREASTS:  Deferred.  HEART:   Regular rate and rhythm.  S1, S2 distinct.  ABDOMEN:  Soft, nontender, nondistended.  Bowel sounds present.  GENITOURINARY:  Deferred.  EXTREMITIES:  Right knee painful with diffuse tenderness, warm to touch.  SKIN:  Red, swollen, warm.  NEUROLOGIC:  Intact distal sensibilities.   LABS:  Pending.  Aspiration with white cells found.   EKG, chest x-ray to be done in presurgical testing on admission to the  hospital.   IMPRESSION:  1. 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  2. Osteoarthritis.  3. Hypertension.  4. Diabetes.   PLAN OF ACTION:  Admit to hospital for resection of right total knee  with insertion of antibiotic spacer with long-term IV antibiotic  therapy.  The risks and complications were discussed.  Questions were  encouraged, answered and reviewed.     ______________________________  [name] [name], [place]      James Frankel. [name], M.D.  Electronically Signed    BLM/MEDQ  D:  04/13/2008  T:  04/13/2008  Job:  [id number]   cc:  Tera Mater. Clent Ridges, MD  [name] [place]  [place] [address]

## 2011-05-08 NOTE — H&P (Signed)
NAME:  [name], COLLEGE NO.:  [id number]   MEDICAL RECORD NO.:  [id number]          PATIENT TYPE:  INP   LOCATION:                               FACILITY:  [facility]   PHYSICIAN:  Madlyn Frankel. Charlann Boxer, M.D.  DATE OF BIRTH:  11-27-1946   DATE OF ADMISSION:  11/08/2008  DATE OF DISCHARGE:                              HISTORY & PHYSICAL   PROCEDURE:  The procedure will be a left total [name] replacement.   CHIEF COMPLAINT:  Left [name] pain.   HISTORY OF THE PRESENT ILLNESS:  A 65 year old male with a history of  left [name] pain secondary to osteoarthritis.  It has been refractory to  all conservative treatment including oral anti-inflammatories, cortisone  injections.  He has a history of right total [name] replacement in the  past and he has done extremely well with his long term rehabilitation.   PAST MEDICAL HISTORY:  Significant for:  1. Osteoarthritis.  2. Hypertension.  3. Diabetes.   PAST SURGICAL HISTORY:  Right total [name] replacement with subsequent  infection of his right total [name] and revision surgery.   FAMILY HISTORY:  Heart disease, diabetes, dementia.   SOCIAL HISTORY:  Married.  The primary caregiver will be his wife, [name],  in the home after surgery.   DRUG ALLERGIES:  No known drug allergies.   MEDICATIONS:  1. Celebrex 200 mg p.o. b.i.d.  2. Simvastatin 40 mg p.o. daily.  3. Cozaar 50 mg p.o. daily.  4. Avandamet 500 mg p.o. daily.  5. Multiple vitamin daily.  6. Aspirin 81 mg p.o. daily.  7. Iron 65 mg p.o. b.i.d.   REVIEW OF SYSTEMS:  See HPI.   PHYSICAL EXAMINATION:  VITALS SIGNS:  Pulse of 72, respirations 18,  blood pressure 112/74.  GENERAL:  Awake, alert and oriented, well-developed, well-nourished  appearing.  NECK:  Neck is supple.  No carotid bruits.  CHEST:  Lungs are clear to auscultation bilaterally.  BREASTS:  Deferred.  HEART:  Regular rate and rhythm, S1, S2 distinct.  ABDOMEN:  Soft and nontender, nondistended. Bowel sounds  are present.  GENITOURINARY:  Deferred.  EXTREMITIES:  The left [name] has significant valgus deformity, still  comes out to full extension.  The right [name] has great range of motion 0  to 120 degrees with quadriceps strength of 4+/5.  SKIN:  Left [name] old scars from previous surgeries but no sign of  cellulitis.  NEUROLOGIC:  Intact distal sensibilities, left lower extremity.   LABORATORY DATA:  Labs, EKG, chest x-ray are all pending presurgical  testing.   IMPRESSION:  Left [name] osteoarthritis.   PLAN OF ACTION:  Left total [name] replacement at [facility] on  November 08, 2008 by Dr. Durene Romans.  The risks and complications were  discussed.   Postoperative medications including Lovenox, Robaxin, iron, aspirin,  Colace, MiraLax provided at the time of surgery.  Celebrex to be  utilized perioperatively as well.   The patient is planned to get straight outpatient physical therapy after  surgery.  He will start on Monday the 23rd.     ______________________________  Sharlet Salina L. [address], [place]      Madlyn Frankel. Charlann Boxer, M.D.  Electronically Signed    BLM/MEDQ  D:  10/22/2008  T:  10/22/2008  Job:  [id number]

## 2011-05-08 NOTE — H&P (Signed)
NAME:  [name], [name]              ACCOUNT NO.:  [id number]   MEDICAL RECORD NO.:  [id number]         PATIENT TYPE:  LINP   LOCATION:                               FACILITY:  [facility]   PHYSICIAN:  Madlyn Frankel. Charlann Boxer, M.D.  DATE OF BIRTH:  23-Jun-1946   DATE OF ADMISSION:  07/30/2007  DATE OF DISCHARGE:                              HISTORY & PHYSICAL   PROCEDURE:  I&D right total knee arthroplasty with poly exchange.   CHIEF COMPLAINTS:  Right knee pain.   HISTORY OF PRESENT ILLNESS:  This 64 year old male with a history of  right total knee arthroplasty in February 2008.  He had been doing well  until he bumped his knee last week and had subsequent pain, fever and  swelling.  He presented to our office and saw Dr. Darrelyn Hillock who re-  aspirated and sent his fluids off for cultures and microscopic  evaluation.  Aspiration was positive for abundant white blood cells.  He  returned to see Dr. Charlann Boxer on Wednesday morning, discussed the need for  washout and I&D of his knee with poly exchange.   PAST MEDICAL HISTORY:  1. Osteoarthritis.  2. Hypertension.  3. Diabetes type 2.   PAST SURGICAL HISTORY:  1. Right total knee arthroplasty in February 2008.  2. Left knee arthroscopic surgery in 1964 and 1966.   FAMILY MEDICAL HISTORY:  Heart disease, diabetes, cancer.   SOCIAL HISTORY:  Wife, [name], primary caregiver.  He is a nonsmoker.   DRUG ALLERGIES:  No known drug allergies.   MEDICATIONS:  1. Avandamet 2 mg/500 one p.o. b.i.d.  2. Simvastatin 20 mg 1 p.o. daily.  3. Cozaar 50 mg 1 p.o. daily.  4. Aspirin 81 mg 1 p.o. daily.  5. Centrum Silver multivitamin p.o. daily.  6. Celebrex 200 mg 1 p.o. daily.   REVIEW OF SYSTEMS:  See HPI.   PHYSICAL EXAMINATION:  Pulse 76, respirations 18, blood pressure 132/80.  GENERAL:  He is awake, alert and oriented, well-developed, well-  nourished in no acute distress.  NECK:  Supple.  No carotid bruits.  CHEST:  Lungs clear to auscultation  bilaterally.  BREASTS:  Deferred.  HEART:  Regular rate and rhythm.  S1-S2 distinct.  ABDOMEN:  Soft, nontender, nondistended.  Bowel sounds positive.  GENITOURINARY:  Deferred.  EXTREMITIES:  Right knee is painful with diffuse tenderness, warm to  touch.  Dorsalis pedis pulse positive, right lower extremity.  SKIN:  Red, swollen, warm.  NEUROLOGIC:  Intact distal sensibilities.   LABORATORY DATA:  EKG and chest x-ray pending presurgical testing and  admission to hospital.   IMPRESSION:  1. Right knee infection, right total knee arthroplasty.  2. Osteoarthritis.  3. Hypertension.  4. Diabetes type 2.   PLAN OF ACTION:  I&D right total knee arthroplasty with polyethylene  exchange [facility], Dr. Durene Romans.  Risks and  complications were discussed.  Questions were encouraged, answered and  reviewed.     ______________________________  [name], [place]      Madlyn Frankel. Charlann Boxer, M.D.  Electronically Signed    BLM/MEDQ  D:  07/30/2007  T:  07/30/2007  Job:  [id number]   cc:   Jeannett Senior A. Clent Ridges, MD  [address] [place]  [place] [address]

## 2011-05-08 NOTE — H&P (Signed)
NAME:  JOHNDAVID, GERALDS NO.:  [id number]   MEDICAL RECORD NO.:  [id number]         PATIENT TYPE:  LINP   LOCATION:                               FACILITY:  [facility]   PHYSICIAN:  Madlyn Frankel. Charlann Boxer, M.D.  DATE OF BIRTH:  [birth year]   DATE OF ADMISSION:  05/04/2008  DATE OF DISCHARGE:                              HISTORY & PHYSICAL   ADDENDUM:   PAST MEDICAL HISTORY:  Significant for:  1. Osteoarthritis.  2. Hypertension.  3. Diabetes type 2.     ______________________________  Yetta Glassman. [address], [place]      Madlyn Frankel. Charlann Boxer, M.D.  Electronically Signed    BLM/MEDQ  D:  04/23/2008  T:  04/23/2008  Job:  [id number]

## 2011-05-08 NOTE — Op Note (Signed)
NAME:  [name], [name]              ACCOUNT NO.:  [id number]   MEDICAL RECORD NO.:  [id number]          PATIENT TYPE:  INP   LOCATION:  0001                         FACILITY:  [facility]   PHYSICIAN:  Madlyn Frankel. Charlann Boxer, M.D.  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   PROCEDURE:  Re-implantation or revision, right total knee replacement  utilizing Dupuy component/5 TC3 femur with 4 MBP revision trays, size 15  mm inserts and a 38 patellar button.   SURGEON:  Madlyn Frankel. Charlann Boxer, M.D.   ASSISTANT:  Yetta Glassman. [address] Ave, [place]   ANESTHESIA:  Regional plus a spinal block.   FINDINGS:  There was no obvious sign of infection.  Initial Gram's stain  was negative for any bacteria.   ESTIMATED BLOOD LOSS:  Was 150 mL.   TOURNIQUET TIME:  The tourniquet was let down after 84 minutes at 250  mmHg.   DRAINS:  One.   COMPLICATIONS:  None.   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  He did have attempt at a single stage treatment; however,  following this he became reinfected in March  of this year and his knee  was resected.  Antibiotic spacer was placed.  He finished the course of  antibiotics and had no signs of recurrent infection three weeks after  his antibiotics were stopped.  The risks and benefits of re-infection,  deep venous thrombosis, complete failure and need for further surgery  were all reviewed, with the hopes of improvement.   DESCRIPTION OF PROCEDURE:  The patient was brought to the operating  theater.  Once adequate anesthesia, preoperative antibiotics had been  given and 2 grams of Ancef, the patient was  positioned supine.  The  right lower extremity was pre-scrubbed and then prepped and draped  over  a nonsterile proximal thigh tourniquet.  The patient's old incision was  utilized.  There was some scar contracture, so I did incise distally to  allow exposure.   Please note that the skin of the distal portion of the knee was thin and  there ended up being an avulsion tear at the inferior portion of the  knee that was reapproximated at the end of the case using a #3-0 nylon.   Following exposure, scar debridement and re-creation of the  suprapatellar pouch and medial and lateral gutters, I was able to  exposure the knee without a quadriceps snip or any complicated features  off of the proximal tibia.  I did put a smooth pin into the proximal  tibia with avulsion of the tibia and the patellar tendon.  Following  exposure, I first dealt with the femur and placed an intramedullary rod.  I then found out I was going to need a 4 mm  augment on the distal aspect  of the femur, cutting it at 5 degrees valgus.  I freshened up these cuts  with a neutral cut just medial and a 4 mm cut off the distal lateral.   I then went ahead and hand-reamed up the 15, in case I needed a 13 mm  stem.  I decided, however, just to use a TC3 component, giving more  stability that way in addition to the cement.  I removed the size 12  reamer in the canal and a 14 centralizer.  I went ahead and used the  size 5 cutting block with a +2 bolt.  With this +2 bolt, I had no cut  anteriorly with a good cut and orientation, but then also had minimal  cuts up the posteromedial and lateral aspects.  There was no  augmentation needed there.  The bone was fairly sclerotic.  I then did  the chamfer cut as well as the TC3 box cut in this orientation.  I  debrided some further scar as well as the holes in the sclerotic bone  that was present, preparing this for the final component.  In  preparation of the femur, I attended to the  tibia.  I used a neutral or  0-degree cutting block, placed approximately 2 cm  away from the tibia  accounting for a 2-degree difference.  I cut off the proximal tibia in a  perpendicular plane.  I then determined this cut surface was felt  stable.  A size 4 tibial component which was a 5% to 10% overhanging  laterally or medially.  I pinned it in position and then drilled for an  MBT revision tray.  The MBT revision tray trial was then placed  on this  cut surface and then a size 5 femur.  I brought the knee out with a size  4.5 poly, given the fact that I initially had removed the size 10.  There is still a bit of a hyperextension, so I trialed with a 15 and was  accurate with this.  With the trial components in place, I re-cut the  tibia as well.  I resected a few  millimeters of bone and then used a 38  patellar button.  The final height of the patella was only 28 mm.  At  this point all trial components were removed.  The final wound  preparation was carried out.  The wound was irrigated now with 3 liters  of normal saline solution.  Three batches of cement were mixed with 2  grams of vancomycin on the back table.  The final components were opened  and compared.  The cement was ready.  The tibial component was cemented  in first onto a dried surface.  The orientation was held while the  cement cured.  The femoral component was then cemented next with a 15  trial insert placed.  The knee was brought out to full extension,  resting the tibia orientation through the medial third of the tubercle,  with a trial tray passing through neutral at this position.  Excess  cement was removed.  Once the cement had cured, I spent time removing  the remaining cement.  I then trialed with a 15 insert and was happy  with the stability that was present with extension and with flexion.   I re-irrigated the wound at this point and then placed a 15 mm insert.  I then irrigated the knee out with 3 liters  of normal saline again.  The  tourniquet was let down after 84 minutes.  There was no significant  hemostasis that was necessary.  A medium Hemovac drain was placed.  I  then reapproximated the tension mechanism with a combination of #1  Vicryl and #2 Quill suture.  The extension mechanism reapproximated  very well without complication.  I used #2-0 Vicryl in the subcu layer  and staples on the skin.   Please note again that there was a tearing of the skin distally, which I  reapproximated using #3-0 nylon.  The knee was then cleaned, dried and  dressed sterilely with Xeroform and a bulky sterile wrap.  He was  brought to the recovery room in stable condition under a spinal and  regional anesthesia.      Madlyn Frankel Charlann Boxer, M.D.  Electronically Signed     MDO/MEDQ  D:  05/04/2008  T:  05/04/2008  Job:  [id number]

## 2011-05-08 NOTE — Discharge Summary (Signed)
NAME:  [name], [name]              ACCOUNT NO.:  [id number]   MEDICAL RECORD NO.:  [id number]          PATIENT TYPE:  INP   LOCATION:  1606                         FACILITY:  [facility]   PHYSICIAN:  Madlyn Frankel. Charlann Boxer, M.D.  DATE OF BIRTH:  1946/10/20   DATE OF ADMISSION:  02/24/2008  DATE OF DISCHARGE:  03/01/2008                               DISCHARGE SUMMARY   ADMISSION DIAGNOSES:  1. Infected right total knee replacement.  2. Hypertension.  3. Diabetes.   DISCHARGE DIAGNOSES:  1. Infected right total knee replacement.  2. Hypertension.  3. Diabetes.  4. Acute blood loss anemia.   CONSULTATIONS:  Infectious disease for antibiotic recommendation.   PROCEDURE:  Resection of right total knee replacement by surgeon Dr.  Durene Romans, assistant Dwyane Luo [facility].   HISTORY OF PRESENT ILLNESS:  Mr. Staup is a very pleasant 65 year old  gentleman with a history of right total knee replacement approximately 9  months ago.  He presented to our office after having some fevers, chills  and painful knee.  He also has a history of I&D and polyethylene  exchange in the past as well.  When we aspirated his knee in the office,  we did find white cells.  He will then subsequently be admitted to the  hospital for resection of the right total knee with long-term antibiotic  administration.   LABORATORY DATA:  CBC at admission:  Hematocrit 30.1.  He was transfused  2 units.  At discharge, he was stable at 26.3.  White cell differential:  No significant abnormalities.  He did have a blood count, his RBCs were  2.84 on February 26, 2008.  Coagulation:  INR 0.9, PT 12.6.  Routine  chemistry:  Glucose remained elevated throughout his course of stay.  At  admission it was 161.  His sodium was 138, potassium 4.2 tracked  throughout his course stay.  At discharge sodium 135, potassium 4,  glucose 129, creatinine 1.01.  Kidney function normal with his calcium  8.6 at discharge.  GI workup showed his total  protein 5.8, albumin 2.2.  Serum protein electrophoresis showed his total protein to be 5.5, serum  albumin 44.9, alpha I globulin 10.6, alpha 2 globulin 17.3, beta  globulin 7.8.  Anemia study showed his iron to be 15, percent saturation  to be 7, ferritin 205 and normal.  Folate normal.  Specific protein  studies of kappa free light chains were high at 2.22.  Blood cultures  showed no growth after 5 days.  Wound cultures of his knee showed no  growth at 3 days.   DIAGNOSTICS:  EKG:  Normal sinus rhythm.   HOSPITAL COURSE:  The patient was admitted to the hospital and had  resection on February 24, 2008, and admitted to h orthopedic floor.  Seen on  postop day number 1, pain was much worse this time.  He was on PCA drip.  We did discontinue his Hemovac.  His dressing was dry.  He was in a knee  immobilizer at all times.  Pharmacy helped with administration of  vancomycin and tracking the trough.  He made very little progress with  physical therapy on postop day number 1.  Infectious disease came by to  see him and recommended continuing the vancomycin until total cultures  came back.   He was seen on postop day number 2 on February 26, 2008.  He was doing okay  given the situation.  He did have a drop in his hematocrit down to 22.4.  We transfused a couple of  units of blood.  PICC line was placed this  day.  Pharmacy continued to follow with his vancomycin.  He had a  significant amount of pain.  He was basically only able to do some  transfers with mobility of 3-5 feet.  [facility].  Infectious disease came to see him with recommendations to complete 6  weeks of IV antibiotics with pending sensitivities.   He was seen on February 27, 2008 doing better than the previous stay.  His  PCA was discontinued.  Hematocrit was back up to 24.9.  We did do a  chest x-ray which was within normal limits.  Negative for pneumonia.  Pharmacy continued to follow him up throughout his  course of stay.  Infectious disease saw him on February 27, 2008, found his sensitivities and  changed his vancomycin over to Rocephin 2 gm IV q.24 h. for the rest the  outpatient therapy.  For his anemia, they did a few blood studies as  noted in the lab portion.  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  He made improved progress physical therapy.   He was seen on February 28, 2008.  He was comfortable.  His hematocrit was  24.2.  Other lab values stable.  Infectious disease saw him.  They  wanted to await the serum protein electrophoresis before signing off on  him.  He was seen on February 29, 2008.  He was comfortable.  He did have a  superficial blister of his right lower extremity.   He was seen on March 01, 2008 comfortable.  Infectious disease  recommended the Rocephin and signed off on him.  He was ready to be  discharged home with home health care PT with a [employer] hinged knee  brace in full extension.   DISCHARGE DISPOSITION:  Discharged home in stable condition with knee in  full extension with a [employer] hinged knee brace.   DISCHARGE DIET:  He is diabetic.   DISCHARGE WOUND CARE:  Keep dry.   FOLLOW UP:  Follow up with Dr. Charlann Boxer.  Phone number [phone].   DISCHARGE MEDICATIONS:  1. Lovenox 40 mg subcu. q.24 h. x 8 days.  2. Aspirin 325 mg 1 p.o. daily x 4 weeks after Lovenox completed.  3. Robaxin 500 mg 1 p.o. q.6 h. muscle spasm and pain.  4. Oxycodone 5 mg 1-2 p.o. q.4-6 h. p.r.n. pain.  5. Rocephin 2 gm IV q. 24 x 6 weeks.   HOME MEDICATIONS:  1. Simvastatin 40 mg 1 p.o. q.h.s.  2. Cozaar 50 mg 1 p.o. daily.  3. Avandamet two 500 mg tablets p.o. b.i.d.  4. Multivitamin daily.     ______________________________  [name], [place]      Madlyn Frankel. Charlann Boxer, M.D.  Electronically Signed    BLM/MEDQ  D:  03/30/2008  T:  03/30/2008  Job:  [id number]

## 2011-05-11 NOTE — Discharge Summary (Signed)
NAME:  [name], [name] NO.:  [id number]   MEDICAL RECORD NO.:  [id number]          PATIENT TYPE:  INP   LOCATION:  5013                         FACILITY:  [facility]   PHYSICIAN:  Madlyn Frankel. Charlann Boxer, M.D.  DATE OF BIRTH:  Jun 02, 1946   DATE OF ADMISSION:  02/05/2007  DATE OF DISCHARGE:  02/07/2007                               DISCHARGE SUMMARY   ADMITTING DIAGNOSES:  1. Osteoarthritis.  2. Hypertension.  3. Diabetes type 2.   DISCHARGE DIAGNOSES:  1. Osteoarthritis.  2. Hypertension.  3. Diabetes type 2.   CONSULTS:  None.   PROCEDURE:  Right total knee arthroplasty.   SURGEON:  Dr. Durene Romans   ASSISTANT:  Dwyane Luo, PA-C   BRIEF HISTORY OF PRESENT ILLNESS:  [name] is a 65 year old male  well known to our practice who has a history of persistent progressive  right knee pain.  He has failed all conservative measures which include  over-the-counter antiinflammatories, multiple cortisone injections as  well as viscous supplementation.  Due to a diminished quality of life,  he was scheduled for a right total knee replacement.  He was cleared pre-  medically by his primary care physician.   HOSPITAL COURSE:  Patient underwent right total knee replacement,  tolerated the procedure well and was admitted to orthopedic floor.  Pain  was well controlled throughout his course of stay.  On postop day number  one, patient complained of some chest tightness around both rib areas.  An EKG as well as cardiac markers were ordered.  The EKG revealed a  normal sinus rhythm, cardiac markers were all negative with his troponin  being 0.01.  Otherwise, orthopedically he was doing well.  Postoperatively, his dressing was checked, it was clean and dry and  intact.  He was neuromuscularly and vascularly intact and was able to do  a positive straight leg raise.  PT/OT was begun, weightbearing as  tolerated with the use of a rolling walker.  His hemoglobin and  hematocrit were  11.9 and 34.2 on postop day number one.  His chemistry  showed that his sodium was 136, potassium 3.8, his glucose 136.  Physical therapy did an evaluation, he was able to ambulate 75 feet with  the use of a rolling walker, recommended home health care physical  therapy upon discharge.  Diabetes treatment program recommendations were  made by diabetes coordinator as duly noted in the chart.  Postop day  number two doing very well, no further chest pain or complications, he  continued to be afebrile and he had a positive straight leg raise, his  dressing was changed, his wound was intact without active drainage.  Planned to have him discharged that afternoon due to his rapid progress  after two series of physical therapy.  [facility] was selected  for home health care.   Preoperative chest x-ray normal.  Preadmission urinalysis was not  performed.  Preadmission coagulation showed his INR to be 1.1.   DISCHARGE DISPOSITION:  Stable and improved.  Discharged home with home  health care physical therapy.   DISCHARGE WOUND CARE:  Keep wound dry, change with a dry dressing daily.  May shower, however cover with impervious surface and taped edges.   DISCHARGE MEDICATIONS:  Include:  1. Simvastatin 20 mg one p.o. q.d.  2. Cozaar 50 mg one p.o. q.d.  3. Avandamet 2/500 one p.o. b.i.d.  4. Celebrex 200 mg one p.o. b.i.d. x5 days then Celebrex 200 mg one      p.o. q.d.  5. Centrum Silver.  6. Lovenox 30 mg one subcutaneous q.12 x12 additional days.  7. Vicodin 5/325 one to two p.o. q.4-6 p.r.n. pain.  8. Robaxin 500 mg one p.o. q.6 p.r.n. muscle spasm.  9. Enteric-coated aspirin 325 mg one p.o. q.d. x4 weeks after Lovenox      completed.  10.Colace 100 mg one p.o. b.i.d. p.r.n. constipation.  11.MiraLax 17 gm one p.o. q.d. p.r.n. constipation.   DISCHARGE SPECIAL INSTRUCTIONS:  1. Follow up with Dr. Charlann Boxer, [phone], in two weeks for wound check.  2. If develop any acute shortness  of breath or severe calf pain,      please call emergency services immediately.   DISCHARGE PHYSICAL THERAPY:  Goals of physical therapy:  Will work on  gait retraining, proprioception.  Want to minimize pain, maximize range  of motion, increase strength and encourage independence in activities of  daily living.  He is weightbearing as tolerated with use of rolling  walker with progression to a single point cane after several weeks and  then to non-assisted ambulation.     ______________________________  [name], [place]      Madlyn Frankel. Charlann Boxer, M.D.  Electronically Signed    BLM/MEDQ  D:  03/18/2007  T:  03/18/2007  Job:  [id number]

## 2011-05-11 NOTE — Assessment & Plan Note (Signed)
[facility]                              [facility] OFFICE NOTE   James [name], James [name]                        MRN:          [id number]  DATE:10/21/2006                            DOB:          [birth year]    This is [name] 65 year old gentleman here for complete physical examination.  Generally, he is doing well except for knee problems.  He continues to see  Dr. Charlann [name] for his painful knees.  He had [name] cortisone shot in his right knee  last week.  This helped [name] bit but not that much.  He continues on daily anti-  inflammatory medications as well.  His sugars have been relatively stable  with morning glucoses in the 110s to 120s recently.  His blood pressure  appears to be doing well.  He had an unremarkable colonoscopy in September  of 2005.  Further details of his past medical history, family history,  social history, etc., refer to his last physical note dated September 12, 2005.   ALLERGIES:  None.   CURRENT MEDICATIONS:  1. Aspirin 325 mg per day.  2. Multivitamin daily.  3. Cozaar 50 mg per day.  4. Zocor 20 mg per day.  5. Avandamet 2/500 b.i.d.  6. Celebrex 200 mg b.i.d.   OBJECTIVE:  Height 6 foot 1 inch, weight 262.  BP 132/64, pulse 88 and  regular.  GENERAL:  He remains quite overweight.  SKIN:  Clear.  HEENT:  Eyes are clear.  Pharynx is clear.  Neck is supple without  adenopathy or masses.  LUNGS:  Clear.  CARDIAC:  Rate and rhythm regular without gallops or rubs.  She has [name] 2/6  systolic murmur loudest at the base which I have not noticed before.  Distal  pulses are full.  EXTREMITIES:  No clubbing, cyanosis, or edema.  ABDOMEN:  Soft, normal bowel sounds, nontender, no masses.  GENITALIA:  Normal male.  RECTAL:  Exam with no masses or tenderness.  Prostate is within normal  limits.  Stool is hemoccult negative.  NEUROLOGICAL:  Grossly intact.   EKG is within normal limits.  He was here for lasting labs on October 14, 2006.   This is remarkable for an LDL of 91 and an A1c of 6.8.   ASSESSMENT/PLAN:  1. Complete physical:  We talked about increasing exercise and losing      weight.  2. Knee pain:  He will follow up with orthopedics.  3. Hypertension, stable.  4. Diabetes mellitus, stable.  5. Hyperlipidemia, stable.  6. New onset cardiac murmur:  We will set him up for an echocardiogram      soon.  7. He is given [name] flu shot today.    ______________________________  James [name]. James Ridges, MD    SAF/MedQ  DD: 10/21/2006  DT: 10/22/2006  Job #: [id number]

## 2011-05-11 NOTE — Discharge Summary (Signed)
NAME:  James, [name] NO.:  [id number]   MEDICAL RECORD NO.:  [id number]          PATIENT TYPE:  INP   LOCATION:  1605                         FACILITY:  [facility]   PHYSICIAN:  Madlyn Frankel. Charlann Boxer, M.D.  DATE OF BIRTH:  04-05-1946   DATE OF ADMISSION:  11/08/2008  DATE OF DISCHARGE:  11/10/2008                               DISCHARGE SUMMARY   ADMISSION DIAGNOSES:  1. Osteoarthritis.  2. Hypertension.  3. Diabetes.   DISCHARGE DIAGNOSES:  1. Osteoarthritis.  2. Hypertension.  3. Diabetes.   HISTORY OF PRESENT ILLNESS:  A 65 year old male with history of left  knee pain secondary to osteoarthritis.  Refractory to all conservative  treatment.  Underwent right total knee replacement while in the  hospital.   CONSULTATION:  None.   PROCEDURE:  A left total knee replaced by surgeon Dr. Durene Romans,  assistant Dwyane Luo PA-C.   LABORATORY DATA:  Final CBC hemoglobin was 10.1, hematocrit 28.9 and his  platelets 167.  White cell differential all within normal limits.  Coags  were negative.  Metabolic panel final reading sodium 132, potassium 4.4,  glucose 109, creatinine 1.3.  Calcium 8.1.  UA from the 13th showed 500  glucose, 30 protein.   Cardiology:  EKG sinus rhythm.  No change from previous rhythm.   Radiology:  No chest x-ray found on chart.   HOSPITAL COURSE:  The patient underwent a left total knee replacement,  admitted to orthopedic floor.  His stay was unremarkable.  He remained  hemodynamically stable.  Dressing was changed on a daily basis with no  sigmoid drainage from the wound.  He made adequate progress with  physical therapy and had met all functional criteria for discharge home  on day two with the ability to a straight leg raise.   DISCHARGE DISPOSITION:  Discharged home in stable improved condition  with home health care PT.   DISCHARGE DIET:  Regular.   DISCHARGE WOUND CARE:  Keep dry.   DISCHARGE PHYSICAL THERAPY:  Weightbearing as  tolerated.   DISCHARGE MEDICATIONS:  1. Lovenox 40 mg subcu q. 24 times 10 days.  2. Robaxin 500 mg p.o. every 6 hours.  3. Iron 325 mg p.o. 3 times daily.  4. Aspirin 325 mg p.o. daily.  5. Colace 100 mg p.o. twice daily.  6. MiraLax 17 grams p.o. daily.  7. Oxycodone 5 mg 1 to 3 p.o. every 3 to 4 hours p.r.n. pain.  8. Doxycycline 100 mg 1 p.o. twice daily times 6 weeks.  9. Cozaar 100 mg tabs 1/2 tablet at supper.  10.Simvastatin 80 mg half tab q.a.m.  11.Avandamet 2/500 one p.o. q.p.m.  12.Celebrex 2 mg one p.o. b.i.d.  13.Multivitamin p.o. daily.   DISCHARGE FOLLOW-UP:  Followup with Dr. Charlann Boxer at phone number [phone]  in two weeks for wound check.     ______________________________  Yetta Glassman. [address], [place]      Madlyn Frankel. Charlann Boxer, M.D.  Electronically Signed    BLM/MEDQ  D:  11/29/2008  T:  11/29/2008  Job:  [id number]   cc:   Jeannett Senior A. Clent Ridges,  MD  [address] [place]  [place] [address]

## 2011-05-11 NOTE — Letter (Signed)
January 07, 2007    Madlyn Frankel. Charlann Boxer, M.D.  [facility]  [address]  [address]  [place], [place] [address]   RE:  DERRIS, MILLAN  MRN:  [id number]  /  DOB:  09-09-1946   Dear Dr. Charlann Boxer,   This letter is concerning a mutual patient of ours by the name of [name] (date of [birth year]). I believe Mr. [name] is scheduled  for a total hip replacement surgery per you on February 13. I last this  patient on October 21, 2006 when we did a complete physical examination.  He was doing well at that time and had no particular complaints except  for his painful knees. To summarize our visit, we had a completely  unremarkable physical examination with the exception of a 2/6 systolic  cardiac murmur that I had noticed before. His EKG that day was normal.  All of his lab work from October 14, 2006 was completely within normal  limits. I did send him for an echocardiogram to evaluate his new murmur.  This was performed on October 29, 2006. This simply showed some very  mild aortic valve stenosis and mild aortic valvular regurgitation, thus  explaining his murmur. I do not feel this poses any risk for him at this  time. In addition to his knee pain, his chief problems that we addressed  that day were hypertension, type 2 diabetes mellitus, and  hyperlipidemia. All of these were well controlled, and he seemed to  be doing well. Basically, I think [name] is in very good physical  condition currently, and I think he can undergo his proposed orthopedic  surgery with very minimal surgical risk.   If I may be of further assistance to you, please do not hesitate to  contact me.    Sincerely,      Tera Mater. Clent Ridges, MD  Electronically Signed    SAF/MedQ  DD: 01/07/2007  DT: 01/07/2007  Job #: [id number]

## 2011-05-11 NOTE — Op Note (Signed)
NAMEIBRAHAM, LEVI NO.:  [id number]   MEDICAL RECORD NO.:  [id number]          PATIENT TYPE:  INP   LOCATION:  2899                         FACILITY:  [facility]   PHYSICIAN:  Madlyn Frankel. Charlann Boxer, M.D.  DATE OF BIRTH:  1946/07/29   DATE OF PROCEDURE:  02/05/2007  DATE OF DISCHARGE:                               OPERATIVE REPORT   PREOPERATIVE DIAGNOSIS:  Right knee osteoarthritis.   POSTOPERATIVE DIAGNOSIS:  Right knee osteoarthritis.   PROCEDURE:  Right total knee replacement.   SURGEON:  Madlyn Frankel. Charlann Boxer, MD.   ASSISTANTYetta Glassman. Mann, PA-C.   COMPONENTS USED:  DePuy rotating-platform, posterior-stabilized knee  system, size-5 femur, 5 tibia, 10-mm insert and a 41 patellar button.   ANESTHESIA:  MAC anesthesia associated with a femoral nerve block and  spinal anesthesia.   COMPLICATIONS:  None.   DRAINS:  Times 1.   TOURNIQUET TIME:  60 minutes at 300 mmHg.   INDICATIONS FOR PROCEDURE:  Mr. Mickelson is a 65 year old gentleman, whom  I have been following for some time for knee osteoarthritis.  He has  failed conservative measures,including arthroscopic procedures and  injections.  Given his last trial physical supplementation did not  provide him with any relief, he wished to proceed with knee replacement  surgery.  We reviewed the risks and benefits of the procedure, including  but not limited to infection, DVT, component failure, need for revision,  the postoperative plan, expectations, potential stiffness, as well as  the need for transfusion.  The consent was obtained.   PROCEDURE IN DETAIL:  The patient was brought to the operating theater.  Once adequate anesthesia and preoperative antibiotics, 2 g of Ancef,  were administered, the patient was positioned supine and a right thigh  tourniquet placed.  The right lower extremity was then prepped and  draped from the ankle to the tourniquet.  A midline incision was made,  followed by a median  parapatellar arthrotomy.  Patellar subluxation was  carried out as opposed to eversion.  Knee exposure was obtained in  routine fashion.  Once knee exposure was obtained, the intramedullary  canal of the femur was opened with a drill, irrigated to prevent emboli,  and then an intramedullary rod passed.  At 5 degrees of valgus, I  resected 2 mm of bone off the distal femur.   At this point, I sized the femur, and based off the anterolateral  cortex, determined that a size-5 femur would prevent any notching and  would be sized appropriately.  I also looked at the size-5 trial and  felt that in the medial and lateral dimensions, this would fit very  well.   Based off the posterior condyles, the external rotation was set, and  this matched in accord with and related to the white-side line.  The  anterior, posterior and chamfer cuts were then made without  complication.  I went ahead and prepared the trochlear box of the femur,  based off the lateral aspect of the distal femur.   At this point, attention was directed to the tibia.  A proximal medial  peel had been carried out due to the medial compartment degenerative  changes noted.   Following this, the extramedullary guide was placed and the  perpendicular cut to the shaft was positioned.  Then, 10 mm of bone was  resected off the lateral proximal tibia.  Following this cut and further  debridement, I used an extension block to identify that the knee came  out to full extension.  Once I had done this, I sized the cut surface  and felt that a size 5 would fit best.  With this 5 tray sitting on the  tibia, an alignment rod was dropped down and found to be perpendicular  in both planes.  I was pleased with this position.  At this point, it  was pinned into position, drilled and keel punched, and a trial  reduction carried out with a size-5 femur.  With a 10-mm insert, the  knee came out to full extension and was stable from extension  into  flexion.  The knee was brought out to extension with the trial  components in place, and attention was directed now to the patella.  Precut measurement was 25 to 26 mm.  I resected down to 15 mm, and felt  that a 41 patellar button most accurately fit.  I then drilled these  holes and placed the trial, and then found that the patella tracked  without application of pressure.  At this point, all trial components  were removed.  Further debridement of soft tissues was carried out as  necessary.  There were no significant osteophytes off the posterior  aspect of the knee that needed to be debrided.   At this point, the knee was then copiously irrigated with normal saline  solution and pulse lavaged, while final components were brought into the  field.  Cement was then mixed.  The components were cemented into  position, with tibia first, then the femur, and then the patella.  A 10-  mm insert was placed and allowed the knee to be brought out into full  extension, while the cement cured.  Once the cement had cured, excessive  cement was debrided throughout the knee.  The insert was removed to  evaluate for any cement posteriorly.  Once I was satisfied there was no  visualized cement debris, the knee was irrigated and the final 10-mm  insert placed.  The knee was then irrigated with pulse lavage for about  500 mL.  A medium Hemovac drain was placed deep.  The tourniquet was let  down at 60 minutes.  The extensor mechanism was then closed with #1  Vicryl with the knee in flexion.  The remaining wound was closed with 2-  0 Vicryl and a running 4-0 Monocryl.  The patient then had his knee  cleaned, dried and dressed sterilely with Steri-Strips, dressing sponges  and a bulky wrap, and the patient was brought into the recovery room in  stable condition.      Madlyn Frankel Charlann Boxer, M.D.  Electronically Signed    MDO/MEDQ  D:  02/05/2007  T:  02/06/2007  Job:  [id number]

## 2011-05-11 NOTE — H&P (Signed)
NAME:  [name], [name] NO.:  [id number]   MEDICAL RECORD NO.:  [id number]          PATIENT TYPE:   LOCATION:                                 FACILITY:   PHYSICIAN:  Benjamin L. [address], [place]   DATE OF BIRTH:  25-Oct-1946   DATE OF ADMISSION:  02/05/2007  DATE OF DISCHARGE:                              HISTORY & PHYSICAL   PROCEDURE:  Right total knee replacement.   CHIEF COMPLAINT:  Right knee pain.   HISTORY OF PRESENT ILLNESS:  A 65 year old male with a history of  persistent and progressive knee pain bilaterally secondary to  osteoarthritis.  All conservative measures have failed to provide  significant relief.  Due to diminished quality of life and persistent  pain he has been scheduled for a right total knee replacement.   PAST MEDICAL HISTORY:  Includes hypertension, diabetes type 2,  osteoarthritis.   FAMILY HISTORY:  Heart disease, diabetes, cancer.   SOCIAL HISTORY:  He is married to [name] and she will be the primary  caregiver after his surgery.   ALLERGIES:  NO KNOWN DRUG ALLERGIES.   MEDICATIONS:  Include  1. Avandamet 2 mg/500 mg one p.o. b.i.d.  2. Celebrex 200 mg one p.o. b.i.d.  3. Simvastatin 20 mg one p.o. q.d.  4. Cozaar 50 mg one p.o. q.d.  5. Aspirin 81 mg one p.o. q.d.  6. Centrum Silver q.d.   REVIEW OF SYSTEMS:  No new signs or symptoms of any cardiovascular,  respiratory, gastrointestinal, genitourinary, neurological,  musculoskeletal system complaints.   PHYSICAL EXAMINATION:  Temperature 99.  Pulse ox 72.  Respirations 18.  Blood pressure 134/66.  GENERAL:  He is awake, alert and oriented.  Well developed, well  nourished, in no acute distress.  NECK:  Supple, no carotid bruits.  CHEST:  Lungs clear to auscultation bilaterally.  HEART:  Regular rate and rhythm.  No splitting noted.  No gallops,  clicks or rubs.  ABDOMEN:  Soft, nontender, nondistended.  Bowel sounds present.  GENITOURINARY:  Deferred.  EXTREMITIES:  Left  knee valgus.  Right knee mild valgus deformity.  Has  no ligament laxity, diffuse tenderness, range of motion is good 0 to 120  bilaterally.  Dorsalis pedis pulses positive right lower extremity.  SKIN:  No signs of cellulitis.  NEUROLOGIC:  Intact distal sensibilities.   LABORATORY DATA:  Pending [facility] presurgical appointment on February 03, 2007.  AP and lateral knees have been reviewed.   IMPRESSION:  Right knee osteoarthritis.   PLAN OF ACTION:  Right total knee replacement at [facility] on February 05, 2007 by Dr. Raylene Everts.  Risks and complications were discussed.  Questions were encouraged, answered and reviewed.  Look forward to  treatment of this very pleasant 65 year old gentleman.           ______________________________  Yetta Glassman. [address], [place]     BLM/MEDQ  D:  01/13/2007  T:  01/13/2007  Job:  [id number]

## 2011-05-11 NOTE — Discharge Summary (Signed)
NAME:  [name], [name]              ACCOUNT NO.:  [id number]   MEDICAL RECORD NO.:  [id number]          PATIENT TYPE:  INP   LOCATION:  1608                         FACILITY:  [facility]   PHYSICIAN:  Madlyn Frankel. Charlann Boxer, M.D.  DATE OF BIRTH:  09/11/1946   DATE OF ADMISSION:  05/04/2008  DATE OF DISCHARGE:  05/07/2008                               DISCHARGE SUMMARY   ADMITTING DIAGNOSES:  1. Infected right total knee.  2. Osteoarthritis,  3. Diabetes.  4. Hypertension.  5. Heart murmur.   DISCHARGE DIAGNOSES:  1. Status post right total knee replacement.  2. Osteoarthritis.  3. Diabetes.  4. Hypertension.  5. Dyslipidemia.   HISTORY OF PRESENT ILLNESS:  This is a 65 year old male with a history  of right total knee replacement in February 2000 with subsequent  infection and resection 8 weeks ago with treatment with antibiotics.  Since then no further fevers or complications.  He has had 3 previous  surgeries.   PROCEDURE:  Reimplantation/revision of the right total knee arthroplasty  by surgeon, Dr. Durene Romans, assistant Dwyane Luo, PA-C.  No signs of  infection during the procedure.   CONSULTS:  None.   LABS:  Preadmission CBC:  Hemoglobin 0.9, hematocrit 34.4, platelets  203.  At time of discharge hemoglobin 8.9, hematocrit 25.7, platelets  131.  White cell differential all within normal limits.  Coagulation all  within normal limits.  Routine chemistry:  Sodium 138, potassium 4.3,  glucose 193, creatinine 1.05.  At time of discharge stable with sodium  140, potassium 4.4, glucose 155 and creatinine 1.05.  His kidney  functions were all normal.  Calcium was 8.2 at discharge.  Synovial  fluid cell count showed 1550 white blood cells.  UA was negative.  Body  fluid culture:  No organisms seen after 3 days.  Gram stain of the  synovial fluid:  No organisms seen.  Anaerobic culture synovial fluid:  No anaerobes isolated.   CARDIOLOGY:  EKG:  Normal sinus rhythm.   RADIOLOGY:  No  chest x-ray found in chart.   HOSPITAL COURSE:  The patient was admitted to the hospital for  reimplantation of right total knee.  His course of stay was unremarkable  with no significant events.  He was afebrile, hemodynamically stable.  Dressing was changed on a daily basis after postop day #1 when the  Hemovac was discontinued.  He was neurovascularly intact to his right  lower extremity and was able to do a straight-leg raise throughout.  He  made excellent progress with his physical therapy and was weightbearing  as tolerated.  By the third day he had made excellent progress and was  familiar with the rehab process at this point with stable creatinine and  was ready for discharge home, neurovascularly intact, and able to do a  straight leg raise, weightbearing as tolerated.   DISCHARGE DISPOSITION:  Discharged home in stable and improved  condition.   DISCHARGE DIET:  Regular.   DISCHARGE WOUND CARE:  Keep dry.   DISCHARGE PHYSICAL THERAPY:  Weightbearing as tolerated with use of  rolling walker.  DISCHARGE MEDICATIONS:  1. Lovenox 40 mg subcu q. 24.  2. Robaxin 500 mg q.6.  3. Iron 325 mg p.o. t.i.d. x2 weeks.  4. Aspirin 325 mg p.o. daily x4 weeks after Lovenox.  5. Colace 100 mg p.o. b.i.d.  6. MiraLax 17 grams p.o. daily.  7. Vicodin 5/325 one to two p.o. q. 4-6 p.r.n. pain.  8. Cozaar 100 mg 1/2 tablet at supper.  9. Simvastatin 80 mg 1/2 tablet at breakfast.  10.Avandamet 2/500 mg one tablet at supper.  11.Celebrex 200 mg one p.o. b.i.d.  12.Centrum Silver multivitamin p.o. daily.   DISCHARGE FOLLOWUP:  Follow up with Dr. Charlann Boxer at [phone].     ______________________________  Yetta Glassman. [address], [place]      Madlyn Frankel. Charlann Boxer, M.D.  Electronically Signed    BLM/MEDQ  D:  06/15/2008  T:  06/15/2008  Job:  [id number]   cc:   Jeannett Senior A. Clent Ridges, MD  [address] [place]  [place] [address]

## 2011-05-11 NOTE — Discharge Summary (Signed)
NAME:  [name], [name]              ACCOUNT NO.:  [id number]   MEDICAL RECORD NO.:  [id number]          PATIENT TYPE:  INP   LOCATION:  1601                         FACILITY:  [facility]   PHYSICIAN:  Madlyn Frankel. Charlann Boxer, M.D.  DATE OF BIRTH:  [birth year]   DATE OF ADMISSION:  07/30/2007  DATE OF DISCHARGE:  08/04/2007                               DISCHARGE SUMMARY   ADMITTING DIAGNOSES:  1. Infection, right knee.  2. Osteoarthritis.  3. Hypertension.  4. Diabetes.   DISCHARGE DIAGNOSIS:  1. Infection, right knee status post right total knee arthroplasty      with polyethylene exchange.  .  2. Osteoarthritis.  3. Hypertension.  4. Diabetes.   HISTORY OF PRESENT ILLNESS:  This is a 65 year old male with a history  of right total knee replacement in February.  He had been doing  excellent and bumped his knee last week.  Had his knee aspirated, sent  off for cultures which showed abundant white blood cells.  Returned to  see Dr. Charlann Boxer who admitted him for a washout and I&D of his knee with  polyethylene exchange.   CONSULTANTS:  1. Infectious disease for antibiotic recommendations.  2. IV team for PICC line placement.   LABORATORY DATA:  On admission, hematocrit 33.2, at 28.5 at discharge.  Coagulation normal.  Routine chemistry:  Glucose elevated slightly at  134 on admission. Had slight postoperative hyponatremia at 130 which  returned back to normal prior to discharge.  His glucose remained  elevated throughout and was 131 at discharge.  Chemistry function  normal.  Calcium 8.4 at discharge.  Routine chemistry GI workup showed  albumin to be 3.  UA was negative.  Wound culture 2 days out from his  right knee was no growth.  No anaerobes isolated either.   HOSPITAL COURSE:  The patient admitted to hospital, underwent incision  and debridement with polyethylene exchange of right total knee.  Surgeon:  Dr. Durene Romans.  Assistant:  Dwyane Luo.  Pharmacy started  vancomycin course  postoperatively.  Had significant amount of pain  postoperatively.  Pain control Percocet in addition to PCA.  PICC line  was placed August 7.  Infectious disease saw him on August 7, agreed  with the IV vancomycin via PICC line for 6 weeks, adding Cipro and  rifampin as well.  He was kept over the weekend to await the cultures.  Pain was under better control.  He progressed nicely with PT and was  ready to leave hopefully by end of Friday.  Infectious disease came by  and saw him on August 8. Recommendations by infectious disease were IV  vancomycin via PICC for 6 weeks, continue p.o. rifampin, Cipro x1 month.  Orthopedics saw him on August 9, doing well.  Wound with no significant  drainage.  He was comfortable, no erythema. I saw him on Monday, and he  was ready for discharge home with antibiotic recommendations per  infectious disease.   DISCHARGE DISPOSITION:  Discharged home in stable and improved condition  without fever.   DISCHARGE DIET:  No restrictions.   DISCHARGE WOUND  CARE:  Keep dry.   DISCHARGE PHYSICAL THERAPY AND ACTIVITY:  Weightbearing as tolerated  with use of rolling walker, transition to cane.  Work on gait training,  proprioception, minimize pain, maximize range of motion, increase  strength.   DISCHARGE MEDICATIONS:  1. Lovenox 30 mg subcutaneously q. 12 h x10 days.  2. Vicodin 5/325 one to two p.o. q. 4-6 h p.r.n. pain.  3. Robaxin 500 mg p.o. q. 6 h.  4. Colace 100 mg p.o. b.i.d.  5. MiraLax 17 grams p.o. daily.  6. Iron 325 mg p.o. 3 times a day x2 weeks.  7. OxyContin 20 mg p.o. q. 12 h p.r.n. breakthrough pain.  8. Cozaar 50 mg p.o. daily.  9. Simvastatin 20 mg p.o. daily.  10.Avandamet 500 mg p.o. b.i.d.  11.Celebrex 200 mg p.o. b.i.d.  12.Centrum Silver multivitamin p.o. daily.   DISCHARGE FOLLOWUP:  Follow up with Dr. Charlann Boxer, [phone], for wound check.     ______________________________  Yetta Glassman. [address], [place]      Madlyn Frankel. Charlann Boxer, M.D.   Electronically Signed    BLM/MEDQ  D:  09/02/2007  T:  09/02/2007  Job:  [id number]

## 2011-09-04 ENCOUNTER — Encounter: Payer: Self-pay | Admitting: Gastroenterology

## 2011-09-17 LAB — BASIC METABOLIC PANEL
BUN: 12
CO2: 27
CO2: 28
Calcium: 7.4 — ABNORMAL LOW
Calcium: 8.1 — ABNORMAL LOW
Calcium: 8.2 — ABNORMAL LOW
Chloride: 102
Creatinine, Ser: 1.04
Creatinine, Ser: 1.1
GFR calc Af Amer: >60
GFR calc Af Amer: >60
GFR calc non Af Amer: >60
Glucose, Bld: 158 — ABNORMAL HIGH
Sodium: 136

## 2011-09-17 LAB — CBC
HCT: 30.1 — ABNORMAL LOW
Hemoglobin: 10.8 — ABNORMAL LOW
Hemoglobin: 8.9 — ABNORMAL LOW
Hemoglobin: 9.6 — ABNORMAL LOW
MCHC: 35.5
MCHC: 35.8
MCHC: 35.9
MCHC: 36.4 — ABNORMAL HIGH
MCV: 81.3
MCV: 83.5
Platelets: 238
Platelets: 269
Platelets: 322
RBC: 3.01 — ABNORMAL LOW
RBC: 3.07 — ABNORMAL LOW
RBC: 3.24 — ABNORMAL LOW
RBC: 3.6 — ABNORMAL LOW
RDW: 13.4
RDW: 13.6
RDW: 13.7
WBC: 5.6
WBC: 6
WBC: 6.7
WBC: 6.8

## 2011-09-17 LAB — KAPPA/LAMBDA LIGHT CHAINS: Kappa, lambda light chain ratio: 0.97 (ref 0.26–1.65)

## 2011-09-17 LAB — DIFFERENTIAL
Basophils Relative: 0
Eosinophils Relative: 3
Eosinophils Relative: 5
Lymphocytes Relative: 16
Lymphocytes Relative: 17
Lymphs Abs: 1
Lymphs Abs: 1
Monocytes Absolute: 0.9
Neutrophils Relative %: 68

## 2011-09-17 LAB — BASIC METABOLIC PANEL WITH GFR
BUN: 20
CO2: 25
Calcium: 8.7
Chloride: 105
Creatinine, Ser: 1.13
GFR calc non Af Amer: >60
Glucose, Bld: 161 — ABNORMAL HIGH
Potassium: 4.2
Sodium: 138

## 2011-09-17 LAB — ANAEROBIC CULTURE

## 2011-09-17 LAB — GRAM STAIN

## 2011-09-17 LAB — LACTATE DEHYDROGENASE: LDH: 182

## 2011-09-17 LAB — HEMOGLOBIN AND HEMATOCRIT, BLOOD: Hemoglobin: 8.7 — ABNORMAL LOW

## 2011-09-17 LAB — PROTEIN ELECTROPH W RFLX QUANT IMMUNOGLOBULINS
Albumin ELP: 44.9 — ABNORMAL LOW
Alpha-1-Globulin: 10.6 — ABNORMAL HIGH
Beta 2: 6.5
Total Protein ELP: 5.5 — ABNORMAL LOW

## 2011-09-17 LAB — TYPE AND SCREEN
ABO/RH(D): O NEG
Antibody Screen: NEGATIVE

## 2011-09-17 LAB — APTT: aPTT: 35

## 2011-09-17 LAB — COMPREHENSIVE METABOLIC PANEL
AST: 21
Albumin: 2.2 — ABNORMAL LOW
Calcium: 8.6
Creatinine, Ser: 1.01
GFR calc Af Amer: >60
GFR calc non Af Amer: >60
Total Protein: 5.8 — ABNORMAL LOW

## 2011-09-17 LAB — TISSUE CULTURE

## 2011-09-17 LAB — CULTURE, BLOOD (ROUTINE X 2)

## 2011-09-17 LAB — ABO/RH: ABO/RH(D): O NEG

## 2011-09-17 LAB — WOUND CULTURE

## 2011-09-17 LAB — PREPARE RBC (CROSSMATCH)

## 2011-09-17 LAB — IRON AND TIBC
Iron: 15 — ABNORMAL LOW
Saturation Ratios: 7 — ABNORMAL LOW
UIBC: 211

## 2011-09-17 LAB — HEMATOCRIT: HCT: 24.1 — ABNORMAL LOW

## 2011-09-17 LAB — VITAMIN B12: Vitamin B-12: 280 (ref 211–911)

## 2011-09-17 LAB — RETICULOCYTES: RBC.: 2.84 — ABNORMAL LOW

## 2011-09-25 LAB — CBC
HCT: 28.9 — ABNORMAL LOW
HCT: 37.8 — ABNORMAL LOW
Hemoglobin: 10.1 — ABNORMAL LOW
MCV: 90.6
Platelets: 181
Platelets: 220
RBC: 3.23 — ABNORMAL LOW
RBC: 4.17 — ABNORMAL LOW
RDW: 13.5
RDW: 13.8
WBC: 5
WBC: 6.8

## 2011-09-25 LAB — URINE MICROSCOPIC-ADD ON

## 2011-09-25 LAB — BASIC METABOLIC PANEL
BUN: 18
BUN: 21
CO2: 27
Chloride: 104
Creatinine, Ser: 1.32
GFR calc Af Amer: >60
GFR calc non Af Amer: 55 — ABNORMAL LOW
GFR calc non Af Amer: 56 — ABNORMAL LOW
Glucose, Bld: 109 — ABNORMAL HIGH
Potassium: 4.3
Potassium: 4.4
Sodium: 132 — ABNORMAL LOW

## 2011-09-25 LAB — URINALYSIS, ROUTINE W REFLEX MICROSCOPIC
Bilirubin Urine: NEGATIVE
Ketones, ur: NEGATIVE
Nitrite: NEGATIVE
Specific Gravity, Urine: 1.026
Urobilinogen, UA: 1

## 2011-09-25 LAB — PROTIME-INR: INR: 1

## 2011-09-25 LAB — GLUCOSE, CAPILLARY
Glucose-Capillary: 104 — ABNORMAL HIGH
Glucose-Capillary: 111 — ABNORMAL HIGH
Glucose-Capillary: 113 — ABNORMAL HIGH
Glucose-Capillary: 130 — ABNORMAL HIGH
Glucose-Capillary: 136 — ABNORMAL HIGH
Glucose-Capillary: 146 — ABNORMAL HIGH
Glucose-Capillary: 155 — ABNORMAL HIGH

## 2011-09-25 LAB — DIFFERENTIAL
Basophils Relative: 1
Eosinophils Absolute: 0.2
Lymphs Abs: 1.1
Monocytes Absolute: 0.5
Monocytes Relative: 9
Neutrophils Relative %: 65

## 2011-09-25 LAB — TYPE AND SCREEN: Antibody Screen: NEGATIVE

## 2011-09-25 LAB — APTT: aPTT: 27

## 2011-10-08 LAB — COMPREHENSIVE METABOLIC PANEL
ALT: 27
Alkaline Phosphatase: 68
BUN: 15
CO2: 27
Chloride: 102
Glucose, Bld: 134 — ABNORMAL HIGH
Potassium: 3.8
Sodium: 135
Total Bilirubin: 0.7
Total Protein: 7.3

## 2011-10-08 LAB — URINALYSIS, ROUTINE W REFLEX MICROSCOPIC
Glucose, UA: NEGATIVE
Ketones, ur: NEGATIVE
Nitrite: NEGATIVE
Specific Gravity, Urine: 1.02
pH: 5.5

## 2011-10-08 LAB — PROTIME-INR: INR: 1.1

## 2011-10-08 LAB — CBC
HCT: 33.2 — ABNORMAL LOW
Hemoglobin: 10 — ABNORMAL LOW
Hemoglobin: 11.4 — ABNORMAL LOW
MCHC: 35.2
MCV: 80.7
Platelets: 284
RBC: 3.56 — ABNORMAL LOW
RBC: 4.13 — ABNORMAL LOW
RDW: 14.9 — ABNORMAL HIGH
WBC: 5.6
WBC: 5.7
WBC: 6.7

## 2011-10-08 LAB — APTT: aPTT: 31

## 2011-10-08 LAB — WOUND CULTURE

## 2011-10-08 LAB — BASIC METABOLIC PANEL
CO2: 28
Calcium: 8.4
Chloride: 98
Creatinine, Ser: 0.85
GFR calc Af Amer: >60
GFR calc Af Amer: >60
GFR calc non Af Amer: >60
GFR calc non Af Amer: >60
Sodium: 135

## 2011-10-08 LAB — ANAEROBIC CULTURE

## 2012-01-02 ENCOUNTER — Encounter (HOSPITAL_COMMUNITY): Payer: Self-pay | Admitting: Respiratory Therapy

## 2012-01-04 ENCOUNTER — Other Ambulatory Visit: Payer: Self-pay | Admitting: Interventional Cardiology

## 2012-01-11 ENCOUNTER — Encounter (HOSPITAL_COMMUNITY): Payer: Self-pay | Admitting: Certified Registered Nurse Anesthetist

## 2012-01-11 ENCOUNTER — Ambulatory Visit (HOSPITAL_COMMUNITY): Payer: 59 | Admitting: Certified Registered Nurse Anesthetist

## 2012-01-11 ENCOUNTER — Other Ambulatory Visit: Payer: Self-pay

## 2012-01-11 ENCOUNTER — Other Ambulatory Visit: Payer: Self-pay | Admitting: Interventional Cardiology

## 2012-01-11 ENCOUNTER — Encounter (HOSPITAL_COMMUNITY)
Admission: RE | Disposition: A | Discharge status: Home / Self Care | Payer: Self-pay | Source: Ambulatory Visit | Attending: Interventional Cardiology

## 2012-01-11 ENCOUNTER — Ambulatory Visit (HOSPITAL_COMMUNITY)
Admission: RE | Admit: 2012-01-11 | Discharge: 2012-01-11 | Disposition: A | Discharge status: Home / Self Care | Payer: 59 | Source: Ambulatory Visit | Attending: Interventional Cardiology | Admitting: Interventional Cardiology

## 2012-01-11 DIAGNOSIS — I4891 Unspecified atrial fibrillation: Secondary | ICD-10-CM | POA: Insufficient documentation

## 2012-01-11 HISTORY — PX: CARDIOVERSION: SHX1299

## 2012-01-11 SURGERY — CARDIOVERSION
Anesthesia: Monitor Anesthesia Care | Wound class: Clean

## 2012-01-11 MED ORDER — SODIUM CHLORIDE 0.9 % IV SOLN
INTRAVENOUS | Status: DC | PRN
  Administered 2012-01-11: 10:00:00 via INTRAVENOUS

## 2012-01-11 MED ORDER — SODIUM CHLORIDE 0.9 % IV SOLN: 250.0000 mL | INTRAVENOUS | Status: DC

## 2012-01-11 MED ORDER — SODIUM CHLORIDE 0.9 % IJ SOLN: 3.0000 mL | Freq: Two times a day (BID) | INTRAMUSCULAR | Status: DC

## 2012-01-11 MED ORDER — PROPOFOL 10 MG/ML IV BOLUS
INTRAVENOUS | Status: DC | PRN
  Administered 2012-01-11: 110 mg via INTRAVENOUS

## 2012-01-11 MED ORDER — SODIUM CHLORIDE 0.9 % IJ SOLN: 3.0000 mL | INTRAMUSCULAR | Status: DC | PRN

## 2012-01-11 MED ORDER — HYDROCORTISONE 1 % EX CREA: 1.0000 "application " | TOPICAL_CREAM | Freq: Three times a day (TID) | CUTANEOUS | Status: DC | PRN

## 2012-01-11 NOTE — Op Note (Signed)
Electrical Cardioversion Procedure Note [name] [id number] 1946-12-16  Procedure: Electrical Cardioversion Indications:  Atrial Fibrillation  Time Out: Verified patient identification, verified procedure,medications/allergies/relevent history reviewed, required imaging and test results available.  Performed  Procedure Details  The patient was NPO after midnight. Anesthesia was administered at the beside  by Dr. Chaney Malling with 110 mg of propofol.  Cardioversion was done with synchronized biphasic defibrillation with AP pads with 120 J biphasic.  The patient converted to normal sinus rhythm. The patient tolerated the procedure well   IMPRESSION:  Successful cardioversion of atrial fibrillation.Continue Xarelto.    [name] S. 01/11/2012, 10:23 AM

## 2012-01-11 NOTE — Anesthesia Postprocedure Evaluation (Signed)
  Anesthesia Post-op Note  Patient: [name]  Procedure(s) Performed:  CARDIOVERSION  Patient Location: Short Stay  Anesthesia Type: MAC  Level of Consciousness: awake, alert  and oriented  Airway and Oxygen Therapy: Patient Spontanous Breathing  Post-op Pain: none  Post-op Assessment: Post-op Vital signs reviewed, Patient's Cardiovascular Status Stable, Respiratory Function Stable, Patent Airway and No signs of Nausea or vomiting  Post-op Vital Signs: Reviewed and stable  Complications: No apparent anesthesia complications

## 2012-01-11 NOTE — Anesthesia Procedure Notes (Signed)
Procedure Name: MAC Date/Time: 01/11/2012 10:17 AM Performed by: Delbert Harness Pre-anesthesia Checklist: Patient identified, Emergency Drugs available, Suction available, Timeout performed and Patient being monitored Patient Re-evaluated:Patient Re-evaluated prior to inductionOxygen Delivery Method: Ambu Bag Preoxygenation: Pre-oxygenation with 100% oxygen Intubation Type: IV induction Ventilation: Mask ventilation without difficulty Dental Injury: Teeth and Oropharynx as per pre-operative assessment

## 2012-01-11 NOTE — Anesthesia Preprocedure Evaluation (Addendum)
Anesthesia Evaluation  Patient identified by MRN, date of birth, ID band Patient awake    Reviewed: Allergy & Precautions, H&P , NPO status , Patient's Chart, lab work & pertinent test results  Airway Mallampati: I TM Distance: >3 FB Neck ROM: Full    Dental  (+) Teeth Intact and Dental Advisory Given   Pulmonary neg pulmonary ROS,    Pulmonary exam normal       Cardiovascular hypertension, Pt. on medications + dysrhythmias Atrial Fibrillation + Valvular Problems/Murmurs AI     Neuro/Psych Negative Neurological ROS  Negative Psych ROS   GI/Hepatic negative GI ROS, Neg liver ROS,   Endo/Other  Diabetes mellitus-, Well Controlled, Type 2, Oral Hypoglycemic Agents  Renal/GU negative Renal ROS  Genitourinary negative   Musculoskeletal  (+) Arthritis -, Osteoarthritis,    Abdominal Normal abdominal exam  (+)   Peds  Hematology negative hematology ROS (+)   Anesthesia Other Findings   Reproductive/Obstetrics negative OB ROS                         Anesthesia Physical Anesthesia Plan  ASA: III  Anesthesia Plan: MAC   Post-op Pain Management:    Induction: Intravenous  Airway Management Planned: Mask  Additional Equipment:   Intra-op Plan:   Post-operative Plan:   Informed Consent: I have reviewed the patients History and Physical, chart, labs and discussed the procedure including the risks, benefits and alternatives for the proposed anesthesia with the patient or authorized representative who has indicated his/her understanding and acceptance.   Dental advisory given  Plan Discussed with: Anesthesiologist and Surgeon  Anesthesia Plan Comments:         Anesthesia Quick Evaluation

## 2012-01-11 NOTE — Transfer of Care (Signed)
Immediate Anesthesia Transfer of Care Note  Patient: [name]  Procedure(s) Performed:  CARDIOVERSION  Patient Location: PACU  Anesthesia Type: MAC  Level of Consciousness: awake, alert  and oriented  Airway & Oxygen Therapy: Patient Spontanous Breathing  Post-op Assessment: Report given to Short Stay RN and Post -op Vital signs reviewed and stable  Post vital signs: Reviewed and stable There were no vitals filed for this visit.  Complications: No apparent anesthesia complications

## 2012-01-11 NOTE — H&P (Addendum)
  Date of Initial H&P: 12/31/11  History reviewed, patient examined, no change in status, stable for surgery.  [name] S. 01/11/2012

## 2012-01-11 NOTE — Preoperative (Signed)
Beta Blockers   Reason not to administer Beta Blockers:Not Applicable 

## 2012-01-15 ENCOUNTER — Encounter (HOSPITAL_COMMUNITY): Payer: Self-pay | Admitting: Interventional Cardiology

## 2012-09-18 ENCOUNTER — Encounter: Payer: Self-pay | Admitting: Gastroenterology

## 2013-09-12 ENCOUNTER — Encounter: Payer: Self-pay | Admitting: *Deleted

## 2013-09-12 ENCOUNTER — Encounter: Payer: Self-pay | Admitting: Interventional Cardiology

## 2013-09-24 ENCOUNTER — Ambulatory Visit (INDEPENDENT_AMBULATORY_CARE_PROVIDER_SITE_OTHER): Payer: Medicare Other | Admitting: Interventional Cardiology

## 2013-09-24 ENCOUNTER — Encounter: Payer: Self-pay | Admitting: Interventional Cardiology

## 2013-09-24 VITALS — BP 132/70 | HR 83 | Ht 73.0 in | Wt 259.0 lb

## 2013-09-24 DIAGNOSIS — E663 Overweight: Secondary | ICD-10-CM

## 2013-09-24 DIAGNOSIS — I4891 Unspecified atrial fibrillation: Secondary | ICD-10-CM

## 2013-09-24 DIAGNOSIS — I359 Nonrheumatic aortic valve disorder, unspecified: Secondary | ICD-10-CM

## 2013-09-24 DIAGNOSIS — I10 Essential (primary) hypertension: Secondary | ICD-10-CM

## 2013-09-24 MED ORDER — RIVAROXABAN 20 MG PO TABS: 20.0000 mg | ORAL_TABLET | Freq: Every day | ORAL | Status: DC

## 2013-09-24 NOTE — Patient Instructions (Addendum)
Your physician wants you to follow-up in: 1 year with Dr. Eldridge Dace. You will receive a reminder letter in the mail two months in advance. If you don't receive a letter, please call our office to schedule the follow-up appointment.   Refill for Xarelto sent into [facility].   Your physician recommends that you continue on your current medications as directed. Please refer to the Current Medication list given to you today.

## 2013-09-24 NOTE — Progress Notes (Signed)
Patient ID: [name], male   DOB: 04/02/1946, 67 y.o.   MRN: [id number]    [address] [place], [place]  [address] Phone: [phone] Fax:  [phone]  Date:  09/24/2013   ID:  [name], DOB 10-30-1946, MRN [id number]  PCP:  Allean Found, MD      History of Present Illness: [name] is a 67 y.o. male who had AFib. He had a DCCV a earlier this year. He had noticed a chest pressure but this has resolved. He is back to exercising. He uses the bike and treadmill. He has been travelling. Atrial Fibrillation F/U:  Denies : Chest pain.  Dizziness.  Leg edema.  Orthopnea.  One episode of Palpitations. Shortlived Syncope.  No sx associated with aortic stenosis.    Wt Readings from Last 3 Encounters:  09/24/13 259 lb (117.482 kg)  03/09/09 272 lb (123.378 kg)  11/04/08 271 lb (122.925 kg)     Past Medical History  Diagnosis Date  . Other and unspecified hyperlipidemia   . HYPERTENSION   . DISORDER, AORTIC VALVE   . DIABETES MELLITUS, TYPE II   . VITAMIN D DEFICIENCY   . ANEMIA-NOS   . DEGENERATIVE JOINT DISEASE, GENERALIZED   . KNEE PAIN, ACUTE   . INFECTION DUE TO INTERNAL JOINT PROSTHESIS   . Atrial fibrillation     Current Outpatient Prescriptions  Medication Sig Dispense Refill  . atorvastatin (LIPITOR) 20 MG tablet Take 20 mg by mouth at bedtime.      Marland Kitchen CALCIUM-VITAMIN D PO Take 1 tablet by mouth daily.      . cholecalciferol (VITAMIN D) 1000 UNITS tablet Take 2,000 Units by mouth daily.      Marland Kitchen diltiazem (CARTIA XT) 180 MG 24 hr capsule Take 180 mg by mouth daily.      Marland Kitchen losartan (COZAAR) 100 MG tablet Take 100 mg by mouth daily.      . Multiple Vitamins-Minerals (MULTIVITAMINS THER. W/MINERALS) TABS Take 1 tablet by mouth daily.      Marland Kitchen omega-3 acid ethyl esters (LOVAZA) 1 G capsule Take 1 g by mouth daily.      . Rivaroxaban (XARELTO) 20 MG TABS Take 20 mg by mouth daily.      . Saxagliptin-Metformin (KOMBIGLYZE XR)  2.04-999 MG TB24 Take 1 tablet by mouth 2 (two) times daily.       No current facility-administered medications for this visit.    Allergies:   No Known Allergies  Social History:  The patient  reports that he has never smoked. He does not have any smokeless tobacco history on file. He reports that  drinks alcohol. He reports that he does not use illicit drugs.   Family History:  The patient's family history includes Alzheimer's disease in his mother and another family member; Breast cancer in his mother and another family member; Diabetes in his father, mother, and another family member; Heart attack in his father and another family member; Heart disease in his father.   ROS:  Please see the history of present illness.     All other systems reviewed and negative.   PHYSICAL EXAM: VS:  BP 132/70  Pulse 83  Ht 6\' 1"  (1.854 m)  Wt 259 lb (117.482 kg)  BMI 34.18 kg/m2 Well nourished, well developed, in no acute distress HEENT: normal Neck: no JVD, no carotid bruits Cardiac:  normal S1, S2; RRR; 3/6 systolic murmur Lungs:  clear to auscultation bilaterally, no  wheezing, rhonchi or rales Abd: soft, nontender, no hepatomegaly Ext: no edema Skin: warm and dry Neuro:   no focal abnormalities noted  EKG:  NSR, NSST inferiorly    ASSESSMENT AND PLAN:   Atrial fibrillation  Continue Cartia XT Capsule Extended Release 24 Hour, 180 MG, 1 capsule, Orally, Once a day, 90 days, 90, Refills 3 Maintaining NSR for now. Xarelto for stroke prevention.    2. Hypercholesteremia, pure  Refill Atorvastatin Calcium Tablet, 20 MG, 1 tablet, Orally, Once a day, 90 days, 90, Refills 3 LDL 77.    3. Obesity, unspecified  Weight up with travelling for work. Try to exercise on the road and eat better as well, when travelling.    4. Hypertension, essential  Continue Losartan Potassium Tablet, 100 MG, 1/2 tab, Orally, Once a day Controlled.   5.  Aortic stenosis: Moderate to severe by prior echo.   Plan repeat echo in Ja 2015.  Discussed sx to watch for.   Preventive Medicine  Adult topics discussed:  Diet: healthy diet.  Exercise: 5 days a week, at least 30 minutes of aerobic exercise.    Follow Up  1 Year (Reason: AFib)     Signed, Fredric Mare, MD, [facility] 09/24/2013 9:47 AM

## 2013-10-12 ENCOUNTER — Other Ambulatory Visit: Payer: Self-pay | Admitting: *Deleted

## 2013-10-12 DIAGNOSIS — I4891 Unspecified atrial fibrillation: Secondary | ICD-10-CM

## 2013-10-12 DIAGNOSIS — Z79899 Other long term (current) drug therapy: Secondary | ICD-10-CM

## 2013-11-12 ENCOUNTER — Encounter: Payer: Self-pay | Admitting: Interventional Cardiology

## 2013-11-18 ENCOUNTER — Telehealth: Payer: Self-pay | Admitting: Interventional Cardiology

## 2013-11-18 ENCOUNTER — Encounter: Payer: Self-pay | Admitting: Cardiology

## 2013-11-18 DIAGNOSIS — I359 Nonrheumatic aortic valve disorder, unspecified: Secondary | ICD-10-CM

## 2013-11-18 DIAGNOSIS — I4891 Unspecified atrial fibrillation: Secondary | ICD-10-CM

## 2013-11-18 NOTE — Telephone Encounter (Signed)
Spoke with pt. I am working on getting pt scheduled for echo and OV on the same day for 12/11/13.

## 2013-11-18 NOTE — Telephone Encounter (Signed)
Follow up     Pt called back and has a question about what test you did him to come in for.   Please call him back on his cell.

## 2013-11-18 NOTE — Telephone Encounter (Signed)
Echo and OV scheduled for 12/11/13 and pt is aware.

## 2013-11-23 ENCOUNTER — Other Ambulatory Visit: Payer: 59

## 2013-11-27 ENCOUNTER — Other Ambulatory Visit: Payer: Medicare Other

## 2013-12-03 ENCOUNTER — Telehealth: Payer: Self-pay | Admitting: Interventional Cardiology

## 2013-12-03 NOTE — Telephone Encounter (Signed)
Follow UP  Pt returned call// sr

## 2013-12-11 ENCOUNTER — Other Ambulatory Visit (HOSPITAL_COMMUNITY): Payer: Medicare HMO

## 2013-12-11 ENCOUNTER — Encounter: Payer: Self-pay | Admitting: Interventional Cardiology

## 2013-12-11 ENCOUNTER — Ambulatory Visit (INDEPENDENT_AMBULATORY_CARE_PROVIDER_SITE_OTHER): Payer: Medicare HMO | Admitting: Interventional Cardiology

## 2013-12-11 VITALS — BP 132/84 | HR 105 | Ht 73.0 in | Wt 261.4 lb

## 2013-12-11 DIAGNOSIS — I4891 Unspecified atrial fibrillation: Secondary | ICD-10-CM

## 2013-12-11 DIAGNOSIS — E663 Overweight: Secondary | ICD-10-CM

## 2013-12-11 DIAGNOSIS — E782 Mixed hyperlipidemia: Secondary | ICD-10-CM

## 2013-12-11 DIAGNOSIS — I359 Nonrheumatic aortic valve disorder, unspecified: Secondary | ICD-10-CM

## 2013-12-11 DIAGNOSIS — I10 Essential (primary) hypertension: Secondary | ICD-10-CM

## 2013-12-11 MED ORDER — DILTIAZEM HCL ER COATED BEADS 240 MG PO CP24: ORAL_CAPSULE | ORAL | Status: DC

## 2013-12-11 MED ORDER — RIVAROXABAN 20 MG PO TABS: 20.0000 mg | ORAL_TABLET | Freq: Every day | ORAL | Status: DC

## 2013-12-11 NOTE — Patient Instructions (Signed)
Your physician recommends that you schedule a follow-up appointment in: 3 months with Dr. Eldridge Dace.  Your physician has recommended you make the following change in your medication:   1. Increase your Diltiazem from 180 mg to 240 mg daily.  Refilled Diltiazem 240mg  and Xarelto 20 mg to [facility].

## 2013-12-11 NOTE — Progress Notes (Signed)
Patient ID: CAL GINDLESPERGER, male   DOB: 1946-01-25, 67 y.o.   MRN: [id number] Patient ID: LADISLAV CASELLI, male   DOB: Mar 12, 1946, 67 y.o.   MRN: [id number]    [address] [place], [place]  [address] Phone: [phone] Fax:  [phone]  Date:  12/11/2013   ID:  ALEXIOS KEOWN, DOB 04-07-1946, MRN [id number]  PCP:  Allean Found, MD      History of Present Illness: [name] is a 67 y.o. male who had AFib. He had a DCCV in 12/2011.  No further chest pain. He is back to exercising. He uses the bike and treadmill. He has been travelling. Atrial Fibrillation F/U:  Denies : Chest pain.  Dizziness.  Leg edema.  Orthopnea.  One episode of Palpitations. Shortlived Syncope.  No sx associated with aortic stenosis.    Wt Readings from Last 3 Encounters:  12/11/13 261 lb 6.4 oz (118.57 kg)  09/24/13 259 lb (117.482 kg)  03/09/09 272 lb (123.378 kg)     Past Medical History  Diagnosis Date  . Other and unspecified hyperlipidemia   . HYPERTENSION   . DISORDER, AORTIC VALVE   . DIABETES MELLITUS, TYPE II   . VITAMIN D DEFICIENCY   . ANEMIA-NOS   . DEGENERATIVE JOINT DISEASE, GENERALIZED   . KNEE PAIN, ACUTE   . INFECTION DUE TO INTERNAL JOINT PROSTHESIS   . Atrial fibrillation     Current Outpatient Prescriptions  Medication Sig Dispense Refill  . atorvastatin (LIPITOR) 20 MG tablet Take 20 mg by mouth at bedtime.      Marland Kitchen CALCIUM-VITAMIN D PO Take 1 tablet by mouth daily.      . cholecalciferol (VITAMIN D) 1000 UNITS tablet Take 2,000 Units by mouth daily.      Marland Kitchen diltiazem (CARTIA XT) 180 MG 24 hr capsule Take 180 mg by mouth daily.      Marland Kitchen losartan (COZAAR) 100 MG tablet Take 100 mg by mouth daily.      . Multiple Vitamins-Minerals (MULTIVITAMINS THER. W/MINERALS) TABS Take 1 tablet by mouth daily.      Marland Kitchen omega-3 acid ethyl esters (LOVAZA) 1 G capsule Take 1 g by mouth daily.      . Rivaroxaban (XARELTO) 20 MG TABS tablet Take 1 tablet (20 mg  total) by mouth daily.  90 tablet  3  . Saxagliptin-Metformin (KOMBIGLYZE XR) 2.04-999 MG TB24 Take 1 tablet by mouth 2 (two) times daily.       No current facility-administered medications for this visit.    Allergies:   No Known Allergies  Social History:  The patient  reports that he has never smoked. He does not have any smokeless tobacco history on file. He reports that he drinks alcohol. He reports that he does not use illicit drugs.   Family History:  The patient's family history includes Alzheimer's disease in his mother and another family member; Breast cancer in his mother and another family member; Diabetes in his father, mother, and another family member; Heart attack in his father and another family member; Heart disease in his father.   ROS:  Please see the history of present illness.     All other systems reviewed and negative.   PHYSICAL EXAM: VS:  BP 132/84  Pulse 105  Ht 6\' 1"  (1.854 m)  Wt 261 lb 6.4 oz (118.57 kg)  BMI 34.49 kg/m2 Well nourished, well developed, in no acute distress HEENT: normal Neck: no JVD, no  carotid bruits Cardiac:  normal S1, S2; irregular; 3/6 systolic murmur Lungs:  clear to auscultation bilaterally, no wheezing, rhonchi or rales Abd: soft, nontender, no hepatomegaly Ext: no edema Skin: warm and dry Neuro:   no focal abnormalities noted  EKG:   AFib, RVR  ASSESSMENT AND PLAN:   Atrial fibrillation  Increase Cartia XT Capsule Extended Release 24 Hour, 240 MG, 1 capsule, Orally, Once a day, 90 days, 90, Refills 3 Maintaining NSR since 2013, Jan.  Back in Afib for the past few months. Xarelto for stroke prevention. He has noted some GERD sx.  He walks stairs without problems.    2. Hypercholesteremia, pure   Atorvastatin Calcium Tablet, 20 MG, 1 tablet, Orally, Once a day, 90 days, 90, Refills 3 LDL 77. LDL 43 in 11/14.    3. Obesity, unspecified  Weight up with travelling for work. Try to exercise on the road and eat better as  well, when travelling.    4. Hypertension, essential  Continue Losartan Potassium Tablet, 100 MG, 1/2 tab, Orally, Once a day Controlled.   5.  Aortic stenosis: Moderate to severe by prior echo.  Plan repeat echo if sx occur.  H elooked into cost and it is much more expensive since we are under the hospital umbrella.  Discussed sx to watch for.   Preventive Medicine  Adult topics discussed:  Diet: healthy diet.  Exercise: 5 days a week, at least 30 minutes of aerobic exercise.    Follow Up  3 months (Reason: AFib)     Signed, Fredric Mare, MD, [facility] 12/11/2013 11:17 AM

## 2014-03-08 ENCOUNTER — Ambulatory Visit (HOSPITAL_COMMUNITY): Payer: Medicare HMO | Attending: Internal Medicine | Admitting: Radiology

## 2014-03-08 ENCOUNTER — Encounter: Payer: Self-pay | Admitting: Internal Medicine

## 2014-03-08 DIAGNOSIS — I4891 Unspecified atrial fibrillation: Secondary | ICD-10-CM

## 2014-03-08 DIAGNOSIS — R0989 Other specified symptoms and signs involving the circulatory and respiratory systems: Secondary | ICD-10-CM

## 2014-03-08 DIAGNOSIS — I359 Nonrheumatic aortic valve disorder, unspecified: Secondary | ICD-10-CM

## 2014-03-08 NOTE — Progress Notes (Signed)
Unable to perform Echocardiogram due to elevated uncontrolled atrial fibrillation. Spoke with Dr. Marlou Porch, DOD.  Patient has appointment with Dr. Irish Lack 03/11/14 @ 8 am.

## 2014-03-11 ENCOUNTER — Ambulatory Visit (INDEPENDENT_AMBULATORY_CARE_PROVIDER_SITE_OTHER): Payer: Medicare HMO | Admitting: Interventional Cardiology

## 2014-03-11 ENCOUNTER — Encounter (INDEPENDENT_AMBULATORY_CARE_PROVIDER_SITE_OTHER): Payer: Self-pay

## 2014-03-11 ENCOUNTER — Encounter: Payer: Self-pay | Admitting: Interventional Cardiology

## 2014-03-11 VITALS — BP 130/60 | HR 110 | Ht 73.0 in | Wt 261.0 lb

## 2014-03-11 DIAGNOSIS — I359 Nonrheumatic aortic valve disorder, unspecified: Secondary | ICD-10-CM

## 2014-03-11 DIAGNOSIS — E785 Hyperlipidemia, unspecified: Secondary | ICD-10-CM

## 2014-03-11 DIAGNOSIS — I4891 Unspecified atrial fibrillation: Secondary | ICD-10-CM

## 2014-03-11 DIAGNOSIS — E663 Overweight: Secondary | ICD-10-CM

## 2014-03-11 NOTE — Patient Instructions (Signed)
Make sure you take your Diltiazem 240 mg every day. Keep readings of heart rate and bring with you.  Your physician recommends that you schedule a follow-up appointment in: 1 month with Dr. Irish Lack.

## 2014-03-11 NOTE — Progress Notes (Signed)
Patient ID: [name], male   DOB: March 25, 1946, 68 y.o.   MRN: [id number] Patient ID: [name], male   DOB: February 13, 1946, 68 y.o.   MRN: [id number] Patient ID: [name], male   DOB: 02-18-1946, 68 y.o.   MRN: [id number]    [place], [place],   [address] Phone: [phone] Fax:  [phone]  Date:  03/11/2014   ID:  [name], DOB 1946-09-16, MRN [id number]  PCP:  Reginia Naas, MD      History of Present Illness: [name] is a 68 y.o. male who had AFib. He had a DCCV in 12/2011.  No further chest pain. He is back to exercising. He uses the bike and treadmill. He has been travelling. Atrial Fibrillation F/U:  Denies : Chest pain.  Dizziness.  Leg edema.  Orthopnea.  One episode of Palpitations. Shortlived Syncope.  No sx associated with aortic stenosis.   HR was high in March 2015 a tthe time of echo.  He has some days where his HR is in the 90-110 range by home monitor. Today, HR was increased by the home monitor.   Wt Readings from Last 3 Encounters:  03/11/14 261 lb (118.389 kg)  12/11/13 261 lb 6.4 oz (118.57 kg)  09/24/13 259 lb (117.482 kg)     Past Medical History  Diagnosis Date  . Other and unspecified hyperlipidemia   . HYPERTENSION   . DISORDER, AORTIC VALVE   . DIABETES MELLITUS, TYPE II   . VITAMIN D DEFICIENCY   . ANEMIA-NOS   . DEGENERATIVE JOINT DISEASE, GENERALIZED   . KNEE PAIN, ACUTE   . INFECTION DUE TO INTERNAL JOINT PROSTHESIS   . Atrial fibrillation     Current Outpatient Prescriptions  Medication Sig Dispense Refill  . atorvastatin (LIPITOR) 20 MG tablet Take 20 mg by mouth at bedtime.      Marland Kitchen CALCIUM-VITAMIN D PO Take 1 tablet by mouth daily.      . cholecalciferol (VITAMIN D) 1000 UNITS tablet Take 2,000 Units by mouth daily.      Marland Kitchen diltiazem (CARTIA XT) 240 MG 24 hr capsule 1 tablet by mouth daily  90 capsule  3  . losartan (COZAAR) 100 MG tablet Take 100 mg by mouth daily.       . Multiple Vitamins-Minerals (MULTIVITAMINS THER. W/MINERALS) TABS Take 1 tablet by mouth daily.      Marland Kitchen omega-3 acid ethyl esters (LOVAZA) 1 G capsule Take 1 g by mouth daily.      . Rivaroxaban (XARELTO) 20 MG TABS tablet Take 1 tablet (20 mg total) by mouth daily.  90 tablet  3  . Saxagliptin-Metformin (KOMBIGLYZE XR) 2.04-999 MG TB24 Take 1 tablet by mouth 2 (two) times daily.       No current facility-administered medications for this visit.    Allergies:   No Known Allergies  Social History:  The patient  reports that he has never smoked. He does not have any smokeless tobacco history on file. He reports that he drinks alcohol. He reports that he does not use illicit drugs.   Family History:  The patient's family history includes Alzheimer's disease in his mother and another family member; Breast cancer in his mother and another family member; Diabetes in his father, mother, and another family member; Heart attack in his father and another family member; Heart disease in his father.   ROS:  Please see the history of present illness.  All other systems reviewed and negative.   PHYSICAL EXAM: VS:  Ht 6\' 1"  (1.854 m)  Wt 261 lb (118.389 kg)  BMI 34.44 kg/m2 Well nourished, well developed, in no acute distress HEENT: normal Neck: no JVD, no carotid bruits Cardiac:  normal S1, S2; irregular; 3/6 systolic murmur Lungs:  clear to auscultation bilaterally, no wheezing, rhonchi or rales Abd: soft, nontender, no hepatomegaly Ext: no edema Skin: warm and dry Neuro:   no focal abnormalities noted  EKG:   AFib, RVR  ASSESSMENT AND PLAN:   Atrial fibrillation  Increased Cartia XT Capsule Extended Release 24 Hour, 240 MG, 1 capsule, Orally, Once a day, 90 days, 90, Refills 3 at last visit in 12/14. Maintained NSR since 2013, Jan.  Back in Afib since late 2014 by sx; confirmed in Dec 2014. Xarelto for stroke prevention. He has noted some GERD sx.  He walks stairs without problems.   He was scheduled for echo but HR was too high, >150.   He was taking his diltiazem, alternating with a lower dose. He will take the 240 mg daily. He will let us not couple of weeks how his heart rate monitor showing his heart rate to be. If it is still high, about increasing to 360 mg daily. We also discussed adding a beta blocker. Given that he has not symptomatic with his heart rates, I am reluctant to add an antiarrhythmic.    2. Hypercholesteremia, pure   Atorvastatin Calcium Tablet, 20 MG, 1 tablet, Orally, Once a day, 90 days, 90, Refills 3 LDL 77. LDL 43 in 11/14.   Fish oil for elevated TG.   3. Obesity, unspecified  Weight up with travelling for work. Try to exercise on the road and eat better as well, when travelling.    4. Hypertension, essential  Continue Losartan Potassium Tablet, 100 MG, 1/2 tab, Orally, Once a day Controlled.   5.  Aortic stenosis: Moderate to severe by prior echo.  Plan repeat echo when HR stabilizes.  He looked into cost and it is much more expensive since we are under the hospital umbrella.  Discussed sx to watch for.   Preventive Medicine  Adult topics discussed:  Diet: healthy diet.  Exercise: 5 days a week, at least 30 minutes of aerobic exercise.    Follow Up  1 months (Reason: AFib)     Signed, Mina Marble, MD, [facility] 03/11/2014 7:58 AM

## 2014-03-19 ENCOUNTER — Encounter: Payer: Self-pay | Admitting: Interventional Cardiology

## 2014-03-24 NOTE — Telephone Encounter (Signed)
OK to increase to Diltiazem 360 mg daily and decrease losartan to 100 mg daily.  THis may help with rate control.

## 2014-04-21 ENCOUNTER — Ambulatory Visit (INDEPENDENT_AMBULATORY_CARE_PROVIDER_SITE_OTHER): Payer: Medicare HMO | Admitting: Interventional Cardiology

## 2014-04-21 ENCOUNTER — Encounter: Payer: Self-pay | Admitting: Interventional Cardiology

## 2014-04-21 VITALS — BP 122/62 | HR 94 | Ht 73.0 in | Wt 263.8 lb

## 2014-04-21 DIAGNOSIS — I4891 Unspecified atrial fibrillation: Secondary | ICD-10-CM

## 2014-04-21 DIAGNOSIS — I359 Nonrheumatic aortic valve disorder, unspecified: Secondary | ICD-10-CM

## 2014-04-21 DIAGNOSIS — E663 Overweight: Secondary | ICD-10-CM

## 2014-04-21 DIAGNOSIS — I10 Essential (primary) hypertension: Secondary | ICD-10-CM

## 2014-04-21 NOTE — Patient Instructions (Signed)
Your physician has requested that you have an echocardiogram. Echocardiography is a painless test that uses sound waves to create images of your heart. It provides your doctor with information about the size and shape of your heart and how well your heart's chambers and valves are working. This procedure takes approximately one hour. There are no restrictions for this procedure.  Your physician recommends that you continue on your current medications as directed. Please refer to the Current Medication list given to you today.  Your physician wants you to follow-up in: 6 months with Dr. Varanasi. You will receive a reminder letter in the mail two months in advance. If you don't receive a letter, please call our office to schedule the follow-up appointment.  

## 2014-04-21 NOTE — Progress Notes (Signed)
Patient ID: [name], male   DOB: [birth year], 68 y.o.   MRN: [id number] Patient ID: [name], male   DOB: 10-02-1946, 68 y.o.   MRN: [id number] Patient ID: [name], male   DOB: 18-Oct-1946, 68 y.o.   MRN: [id number] Patient ID: [name], male   DOB: 27-Feb-1946, 68 y.o.   MRN: [id number]    [place], [address] [place], [place]  [address] Phone: [phone] Fax:  [phone]  Date:  04/21/2014   ID:  [name], DOB 1946-07-11, MRN [id number]  PCP:  Reginia Naas, MD      History of Present Illness: [name] is a 68 y.o. male who had AFib. He had a DCCV in 12/2011.  No further chest pain. He is back to exercising. He uses the bike and treadmill. He has been travelling. Atrial Fibrillation F/U:  Denies : Chest pain.  Dizziness.  Leg edema.  Orthopnea.  One episode of Palpitations. Shortlived Syncope.  No sx associated with aortic stenosis.   HR was high in March 2015 a tthe time of echo.  He has some days where his HR is in the 90-110 range by home monitor. Today, HR was increased by the home monitor.   Wt Readings from Last 3 Encounters:  04/21/14 263 lb 12.8 oz (119.659 kg)  03/11/14 261 lb (118.389 kg)  12/11/13 261 lb 6.4 oz (118.57 kg)     Past Medical History  Diagnosis Date  . Other and unspecified hyperlipidemia   . HYPERTENSION   . DISORDER, AORTIC VALVE   . DIABETES MELLITUS, TYPE II   . VITAMIN D DEFICIENCY   . ANEMIA-NOS   . DEGENERATIVE JOINT DISEASE, GENERALIZED   . KNEE PAIN, ACUTE   . INFECTION DUE TO INTERNAL JOINT PROSTHESIS   . Atrial fibrillation     Current Outpatient Prescriptions  Medication Sig Dispense Refill  . atorvastatin (LIPITOR) 20 MG tablet Take 20 mg by mouth at bedtime.      Marland Kitchen CALCIUM-VITAMIN D PO Take 1 tablet by mouth daily.      . cholecalciferol (VITAMIN D) 1000 UNITS tablet Take 2,000 Units by mouth daily.      Marland Kitchen diltiazem (CARTIA XT) 240 MG 24 hr capsule 1 tablet by  mouth daily  90 capsule  3  . losartan (COZAAR) 100 MG tablet Take 100 mg by mouth daily.      . Multiple Vitamins-Minerals (MULTIVITAMINS THER. W/MINERALS) TABS Take 1 tablet by mouth daily.      Marland Kitchen omega-3 acid ethyl esters (LOVAZA) 1 G capsule Take 1 g by mouth daily.      . Rivaroxaban (XARELTO) 20 MG TABS tablet Take 1 tablet (20 mg total) by mouth daily.  90 tablet  3  . Saxagliptin-Metformin (KOMBIGLYZE XR) 2.04-999 MG TB24 Take 1 tablet by mouth 2 (two) times daily.       No current facility-administered medications for this visit.    Allergies:   No Known Allergies  Social History:  The patient  reports that he has never smoked. He does not have any smokeless tobacco history on file. He reports that he drinks alcohol. He reports that he does not use illicit drugs.   Family History:  The patient's family history includes Alzheimer's disease in his mother and another family member; Breast cancer in his mother and another family member; Diabetes in his father, mother, and another family member; Heart attack in his father and another family  member; Heart disease in his father.   ROS:  Please see the history of present illness.     All other systems reviewed and negative.   PHYSICAL EXAM: VS:  BP 122/62  Pulse 94  Ht 6\' 1"  (1.854 m)  Wt 263 lb 12.8 oz (119.659 kg)  BMI 34.81 kg/m2  SpO2 98% Well nourished, well developed, in no acute distress HEENT: normal Neck: no JVD, no carotid bruits Cardiac:  normal S1, S2; irregular; 3/6 systolic murmur Lungs:  clear to auscultation bilaterally, no wheezing, rhonchi or rales Abd: soft, nontender, no hepatomegaly Ext: no edema Skin: warm and dry Neuro:   no focal abnormalities noted  EKG:   AFib, RVR  ASSESSMENT AND PLAN:   Atrial fibrillation  Increased Cartia XT Capsule Extended Release 24 Hour, 240 MG, 1 capsule, Orally, Once a day, 90 days, 90, Refills 3 at last visit in 12/14. Maintained NSR since 2013, Jan.  Back in Afib since  late 2014 by sx; confirmed in Dec 2014. Xarelto for stroke prevention. He has noted some GERD sx.  He walks stairs without problems.  He was scheduled for echo but HR was too high, >150.   He was taking his diltiazem, alternating with a lower dose. After starting  the 240 mg daily, HR averages 80-90 by fitbit monitor, which averages entire day's heart rate readings.  Did not need to increase to 360 mg daily. We also discussed adding a beta blocker but did not need to start this. Given that he has not symptomatic with his heart rates, I am reluctant to add an antiarrhythmic.  He will lkely need aortic valve replacment at some point.  Could consider Maze procedure at that time.  He does not mind monitoring his heart rhythm at this time.  He has no sx; he knows that his HR increases with activity, up to 150.      2. Hypercholesteremia, pure   Atorvastatin Calcium Tablet, 20 MG, 1 tablet, Orally, Once a day, 90 days, 90, Refills 3 LDL 77. LDL 43 in 11/14.   Fish oil for elevated TG.   3. Obesity, unspecified  Weight up with travelling for work. Try to exercise on the road and eat better as well, when travelling.    4. Hypertension, essential  Continue Losartan Potassium Tablet, 100 MG, 1/2 tab, Orally, Once a day Controlled.   5.  Aortic stenosis: Moderate to severe by prior echo.  Plan repeat echo since HR stabilized.  He looked into cost and it is much more expensive since we are under the hospital umbrella.  Discussed sx to watch for.   Preventive Medicine  Adult topics discussed:  Diet: healthy diet.  Exercise: 5 days a week, at least 30 minutes of aerobic exercise.    Follow Up  1 months (Reason: AFib)     Signed, Mina Marble, MD, [facility] 04/21/2014 2:56 PM

## 2014-05-04 ENCOUNTER — Ambulatory Visit (HOSPITAL_COMMUNITY): Payer: Medicare HMO | Attending: Cardiovascular Disease | Admitting: Radiology

## 2014-05-04 DIAGNOSIS — I359 Nonrheumatic aortic valve disorder, unspecified: Secondary | ICD-10-CM

## 2014-05-04 DIAGNOSIS — I4891 Unspecified atrial fibrillation: Secondary | ICD-10-CM

## 2014-05-04 NOTE — Progress Notes (Signed)
Echocardiogram performed.  

## 2014-06-17 ENCOUNTER — Encounter: Payer: Self-pay | Admitting: *Deleted

## 2014-06-17 ENCOUNTER — Ambulatory Visit (INDEPENDENT_AMBULATORY_CARE_PROVIDER_SITE_OTHER): Payer: Medicare HMO | Admitting: Endocrinology

## 2014-06-17 ENCOUNTER — Encounter: Payer: Self-pay | Admitting: Endocrinology

## 2014-06-17 ENCOUNTER — Other Ambulatory Visit: Payer: Self-pay | Admitting: Endocrinology

## 2014-06-17 VITALS — BP 124/70 | HR 84 | Temp 98.5°F | Resp 16 | Ht 72.0 in | Wt 256.6 lb

## 2014-06-17 DIAGNOSIS — E119 Type 2 diabetes mellitus without complications: Secondary | ICD-10-CM

## 2014-06-17 DIAGNOSIS — E559 Vitamin D deficiency, unspecified: Secondary | ICD-10-CM

## 2014-06-17 DIAGNOSIS — N182 Chronic kidney disease, stage 2 (mild): Secondary | ICD-10-CM

## 2014-06-17 LAB — RENAL FUNCTION PANEL
Albumin: 4.3 g/dL (ref 3.5–5.2)
BUN: 19 mg/dL (ref 6–23)
CHLORIDE: 102 meq/L (ref 96–112)
CO2: 23 meq/L (ref 19–32)
Calcium: 9.5 mg/dL (ref 8.4–10.5)
Creat: 1.19 mg/dL (ref 0.50–1.35)
GLUCOSE: 129 mg/dL — AB (ref 70–99)
Phosphorus: 3.2 mg/dL (ref 2.3–4.6)
Potassium: 4.4 mEq/L (ref 3.5–5.3)
SODIUM: 136 meq/L (ref 135–145)

## 2014-06-17 NOTE — Progress Notes (Signed)
Patient ID: [name], male   DOB: Aug 26, 1946, 68 y.o.   MRN: [id number]   Chief complaint: "Low PTH"  History of Present Illness:  About 5 years ago his orthopedic surgeon had tested his vitamin D and was reportedly low. Not clear how low it was at that time Since then has been on vitamin D supplements regularly Currently he is taking vitamin D 3, 1000 units daily and also gets vitamin D 800 units in his calcium supplements He is not clear why he was put on calcium supplements and has been taking them for the last few years He has records from 2008 and 2009 which showed that his calcium levels previously were 8.1 and 8.4  His primary care physician checked a PTH level in 5/15, unclear why this was done and from which lab. This is slightly low at 12, normal >15 His calcium level was 9.6 and phosphorus 3.1, magnesium normal. No recent vitamin D levels are available  Currently the patient does not complain of any bone pain, leg cramps, tingling in his hands or twitching. Over the last month he has occasional tingling in the fingertips of his first and second fingers for about a minute Rarely he may experience some leg cramps in bed  There is no history of kidney stones He doesn't complain of any fatigue   Past Medical History  Diagnosis Date  . Other and unspecified hyperlipidemia   . HYPERTENSION   . DISORDER, AORTIC VALVE   . DIABETES MELLITUS, TYPE II   . VITAMIN D DEFICIENCY   . ANEMIA-NOS   . DEGENERATIVE JOINT DISEASE, GENERALIZED   . KNEE PAIN, ACUTE   . INFECTION DUE TO INTERNAL JOINT PROSTHESIS   . Atrial fibrillation     Past Surgical History  Procedure Laterality Date  . Cardioversion  01/11/2012    Procedure: CARDIOVERSION;  Surgeon: Jettie Booze, MD;  Location: [facility];  Service: Cardiovascular;  Laterality: N/A;  . Total knee arthroplasty    . Dental surgery      implants 04/2011    Family History  Problem Relation Age of Onset  . Heart attack     . Diabetes    . Breast cancer    . Alzheimer's disease    . Breast cancer Mother   . Alzheimer's disease Mother   . Diabetes Mother   . Heart attack Father   . Heart disease Father   . Diabetes Father     Social History:  reports that he has never smoked. He does not have any smokeless tobacco history on file. He reports that he drinks alcohol. He reports that he does not use illicit drugs.  Allergies: No Known Allergies    Medication List       This list is accurate as of: 06/17/14 11:15 AM.  Always use your most recent med list.               atorvastatin 20 MG tablet  Commonly known as:  LIPITOR  Take 20 mg by mouth at bedtime.     CALCIUM-VITAMIN D PO  Take 1 tablet by mouth daily.     cholecalciferol 1000 UNITS tablet  Commonly known as:  VITAMIN D  Take 2,000 Units by mouth daily.     diltiazem 240 MG 24 hr capsule  Commonly known as:  CARTIA XT  1 tablet by mouth daily     KOMBIGLYZE XR 2.04-999 MG Tb24  Generic drug:  Saxagliptin-Metformin  Take 1  tablet by mouth 2 (two) times daily.     losartan 100 MG tablet  Commonly known as:  COZAAR  Take 100 mg by mouth daily.     multivitamins ther. w/minerals Tabs tablet  Take 1 tablet by mouth daily.     omega-3 acid ethyl esters 1 G capsule  Commonly known as:  LOVAZA  Take 1 g by mouth daily.     rivaroxaban 20 MG Tabs tablet  Commonly known as:  XARELTO  Take 1 tablet (20 mg total) by mouth daily.           REVIEW OF SYSTEMS:            He has had type 2 diabetes with the last A1c 6.8%. He is on Kombiglyze XR and his last lab glucose was 158       Skin: No rash or infections     Thyroid:  No cold or heat intolerance, unusual fatigue.     His blood pressure has been high for several years and is being treated with losartan.  He is also on diltiazem 240 mg because of history of atrial fibrillation     No swelling of feet.      May have occasional tingling or numb feelings in his  toes   PHYSICAL EXAM:  BP 124/70  Pulse 84  Temp(Src) 98.5 F (36.9 C)  Resp 16  Ht 6' (1.829 m)  Wt 256 lb 9.6 oz (116.393 kg)  BMI 34.79 kg/m2  SpO2 97%  GENERAL: Well-built and nourished.  No pallor, clubbing, lymphadenopathy or edema.  Skin:  no rash or pigmentation.  EYES:  Externally normal.   THYROID:  Not palpable.  HEART:  Normal  S1 and S2; no murmur or click.  CHEST:  Normal shape.  Lungs: Vescicular breath sounds heard equally.  No crepitations/ wheeze.  ABDOMEN:  Exam not indicated  NEUROLOGICAL: .Reflexes are bilaterally at biceps.  JOINTS:  Normal finger joints, spine appears normal   ASSESSMENT:   Mildly low PTH of unclear significance He has not had any history of significant hypocalcemia and has been asymptomatic He does have a history of vitamin D deficiency and recent levels are not available. Currently is on supplements totaling 1800 units a day  Although his creatinine clearance is 60 his creatinine is quite normal at 1.2, probably influenced by his being on an ARB drug  PLAN:   Check vitamin D level and renal panel today Discussed with him that a mildly low PTH is not a clinical disease unless associated with hypocalcemia and is only a reaction to the current level of vitamin D and calcium If his vitamin D level is relatively high may need to reduce the dose of his supplements Will also adjust his calcium supplements accordingly  Will discuss results with patient and decide on further actions  KUMAR,AJAY 06/17/2014, 11:15 AM

## 2014-06-18 LAB — VITAMIN D 25 HYDROXY (VIT D DEFICIENCY, FRACTURES): VIT D 25 HYDROXY: 58 ng/mL (ref 30–89)

## 2014-06-18 NOTE — Progress Notes (Signed)
Quick Note:  Vitamin D level is on the high side, previous vitamin D level is only 36 which was not low, stop additional vitamin D and take only one calcium a day. Would like to see him back in followup in 2 months with repeat labs including PTH level from [place]. ______

## 2014-08-16 ENCOUNTER — Ambulatory Visit (INDEPENDENT_AMBULATORY_CARE_PROVIDER_SITE_OTHER): Payer: Medicare Other | Admitting: Endocrinology

## 2014-08-16 ENCOUNTER — Encounter: Payer: Self-pay | Admitting: Endocrinology

## 2014-08-16 ENCOUNTER — Telehealth: Payer: Self-pay | Admitting: Endocrinology

## 2014-08-16 VITALS — BP 121/74 | HR 60 | Temp 98.2°F | Resp 16 | Ht 72.0 in | Wt 254.8 lb

## 2014-08-16 DIAGNOSIS — R7989 Other specified abnormal findings of blood chemistry: Secondary | ICD-10-CM

## 2014-08-16 DIAGNOSIS — E559 Vitamin D deficiency, unspecified: Secondary | ICD-10-CM

## 2014-08-16 LAB — RENAL FUNCTION PANEL
Albumin: 4 g/dL (ref 3.5–5.2)
BUN: 24 mg/dL — ABNORMAL HIGH (ref 6–23)
CO2: 23 mEq/L (ref 19–32)
CREATININE: 1.3 mg/dL (ref 0.4–1.5)
Calcium: 9.4 mg/dL (ref 8.4–10.5)
Chloride: 102 mEq/L (ref 96–112)
GFR: 59.43 mL/min — AB (ref >60.00)
Glucose, Bld: 181 mg/dL — ABNORMAL HIGH (ref 70–99)
PHOSPHORUS: 3.1 mg/dL (ref 2.3–4.6)
Potassium: 4.6 mEq/L (ref 3.5–5.1)
Sodium: 136 mEq/L (ref 135–145)

## 2014-08-16 NOTE — Progress Notes (Signed)
Patient ID: [name], male   DOB: 1946-09-03, 68 y.o.   MRN: [id number]   Chief complaint: "Low PTH"  History of Present Illness:  About 5 years ago his orthopedic surgeon had tested his vitamin D and was reportedly low. Not clear how low it was at that time Since then had been on vitamin D supplements regularly  He is not clear why he was put on calcium supplements and has been taking them for the last few years He has records from 2008 and 2009 which showed that his calcium levels previously were 8.1 and 8.4 Currently he is taking vitamin D 800 units in his calcium supplements and the OTC vitamin D 3, 1000 units was stopped on his last visit because of relatively higher vitamin D level and low PTH  His primary care physician checked a PTH level in 5/15, unclear why this was done and from which lab. This is slightly low at 12, normal >15 His calcium level was 9.6 and phosphorus 3.1, magnesium normal. Last vitamin D level was 56  Currently the patient does not complain of any bone pain, leg cramps, tingling in his hands or twitching.  No recent occasional tingling in the fingertips of his first and second fingers Rarely  may experience some leg cramps in bed but not recently  There is no history of kidney stones Does not complain of any fatigue   Past Medical History  Diagnosis Date  . Other and unspecified hyperlipidemia   . HYPERTENSION   . DISORDER, AORTIC VALVE   . DIABETES MELLITUS, TYPE II   . VITAMIN D DEFICIENCY   . ANEMIA-NOS   . DEGENERATIVE JOINT DISEASE, GENERALIZED   . KNEE PAIN, ACUTE   . INFECTION DUE TO INTERNAL JOINT PROSTHESIS   . Atrial fibrillation   . Hypercholesterolemia   . History of knee replacement     BILATERALLY  . Aortic stenosis, moderate     MOD-SEVERE DR. VARANASI  . Diabetic eye exam     11/18/2013 NO DIABETIC  . Retinopathy     MATURING CATARACTS DR. SALLY MILLER  . Kidney disease, chronic, stage III (GFR 30-59 ml/min)     [facility]     Past Surgical History  Procedure Laterality Date  . Cardioversion  01/11/2012    Procedure: CARDIOVERSION;  Surgeon: Jettie Booze, MD;  Location: [facility];  Service: Cardiovascular;  Laterality: N/A;  . Total knee arthroplasty      RT X2 /LEFT DR. OLIN  . Dental surgery      implants X2  04/2011    Family History  Problem Relation Age of Onset  . Heart attack    . Diabetes    . Breast cancer    . Alzheimer's disease Mother   . Breast cancer Mother   . Alzheimer's disease Mother   . Diabetes Mother   . Heart attack Father   . Heart disease Father   . Diabetes Father   . Cancer Mother     Social History:  reports that he quit smoking about 52 years ago. His smoking use included Cigarettes. He smoked 2.00 packs per day. He does not have any smokeless tobacco history on file. He reports that he drinks alcohol. He reports that he does not use illicit drugs.  Allergies: No Known Allergies    Medication List       This list is accurate as of: 08/16/14 10:11 AM.  Always use your most recent med list.  atorvastatin 20 MG tablet  Commonly known as:  LIPITOR  Take 20 mg by mouth at bedtime.     Calcium Carbonate Antacid 600 MG chewable tablet  Chew 600 mg by mouth 2 (two) times daily.     diltiazem 240 MG 24 hr capsule  Commonly known as:  CARTIA XT  1 tablet by mouth daily     KOMBIGLYZE XR 2.04-999 MG Tb24  Generic drug:  Saxagliptin-Metformin  Take 1 tablet by mouth 2 (two) times daily.     losartan 100 MG tablet  Commonly known as:  COZAAR  Take 50 mg by mouth daily.     multivitamin-iron-minerals-folic acid chewable tablet  Chew 1 tablet by mouth daily.     Omega 3 1000 MG Caps  Take 1,000 mg by mouth daily.     rivaroxaban 20 MG Tabs tablet  Commonly known as:  XARELTO  Take 1 tablet (20 mg total) by mouth daily.     Vitamin D3 2000 UNITS Tabs  Take 2,000 Units by mouth daily.           REVIEW OF SYSTEMS:           He has  had type 2 diabetes with the last A1c 6.2 ?  He is on Kombiglyze XR and he thinks blood sugars are fairly good at home      His blood pressure has been high for several years and is being treated with losartan.  He is also on diltiazem 240 mg because of history of atrial fibrillation  PHYSICAL EXAM:  BP 121/74  Pulse 60  Temp(Src) 98.2 F (36.8 C)  Resp 16  Ht 6' (1.829 m)  Wt 254 lb 12.8 oz (115.577 kg)  BMI 34.55 kg/m2  SpO2 97%  Exam not indicated  ASSESSMENT:   Mildly low PTH of unclear significance He has not had any history of significant hypocalcemia and has been asymptomatic He does have a history of vitamin D deficiency and this has been adequately supplemented  PLAN:   Check PTH and renal panel today Discussed with him that a mildly low PTH is not a clinical disease unless associated with hypocalcemia  With reducing the vitamin D supplements his PTH may improve  His vitamin D level can be monitored annually and her level in the 30s or 40s is adequate  Will discuss results with patient and decide on further actions  [name] 08/16/2014, 10:11 AM   Addendum: PTH still low but serum calcium and phosphorus normal. Significance of isolated low PTH is uncertain and will not pursue this any further, note to PCP

## 2014-08-17 LAB — PARATHYROID HORMONE, INTACT (NO CA): PTH: 10 pg/mL — ABNORMAL LOW (ref 15–65)

## 2014-08-17 NOTE — Progress Notes (Signed)
Quick Note:  Parathyroid hormone level is again low but since calcium is quite normal the test does not need to be addressed any further, no need for further testing either. Continue current dosage of vitamin D supplements ______

## 2014-10-20 ENCOUNTER — Encounter: Payer: Self-pay | Admitting: Interventional Cardiology

## 2014-10-20 ENCOUNTER — Ambulatory Visit (INDEPENDENT_AMBULATORY_CARE_PROVIDER_SITE_OTHER): Payer: Medicare Other | Admitting: Interventional Cardiology

## 2014-10-20 VITALS — BP 122/70 | HR 110 | Ht 73.0 in | Wt 249.0 lb

## 2014-10-20 DIAGNOSIS — I10 Essential (primary) hypertension: Secondary | ICD-10-CM

## 2014-10-20 DIAGNOSIS — E785 Hyperlipidemia, unspecified: Secondary | ICD-10-CM

## 2014-10-20 DIAGNOSIS — I359 Nonrheumatic aortic valve disorder, unspecified: Secondary | ICD-10-CM

## 2014-10-20 DIAGNOSIS — I4819 Other persistent atrial fibrillation: Secondary | ICD-10-CM

## 2014-10-20 DIAGNOSIS — I481 Persistent atrial fibrillation: Secondary | ICD-10-CM

## 2014-10-20 DIAGNOSIS — I480 Paroxysmal atrial fibrillation: Secondary | ICD-10-CM

## 2014-10-20 DIAGNOSIS — E663 Overweight: Secondary | ICD-10-CM

## 2014-10-20 MED ORDER — RIVAROXABAN 20 MG PO TABS: 20.0000 mg | ORAL_TABLET | Freq: Every day | ORAL | Status: DC

## 2014-10-20 MED ORDER — DILTIAZEM HCL ER COATED BEADS 240 MG PO CP24: ORAL_CAPSULE | ORAL | Status: DC

## 2014-10-20 NOTE — Progress Notes (Signed)
Patient ID: [name], male   DOB: 1946-03-01, 68 y.o.   MRN: [id number]      [place], [name], [place]  [address] Phone: [phone] Fax:  [phone]  Date:  10/20/2014   ID:  [name], DOB 05/19/1946, MRN [id number]  PCP:  James Naas, MD      History of Present Illness: [name] is a 68 y.o. male who had AFib. He had a DCCV in 12/2011.  No further chest pain. He is back to exercising. He uses the bike and treadmill at a gym. He has been travelling to [place] to see his grandkids. Atrial Fibrillation F/U:  Denies : Chest pain.  Dizziness.  Leg edema.  Orthopnea.  Palpitations.  Syncope.  No sx associated with aortic stenosis.   HR was high in March 2015 a tthe time of echo.  He has some days where his HR is in the 90-110 range by home monitor. Today, HR was ok by the home monitor.  Weight loss since retirement in 6/15; not eating out as much.   Wt Readings from Last 3 Encounters:  10/20/14 249 lb (112.946 kg)  08/16/14 254 lb 12.8 oz (115.577 kg)  06/17/14 256 lb 9.6 oz (116.393 kg)     Past Medical History  Diagnosis Date  . Other and unspecified hyperlipidemia   . HYPERTENSION   . DISORDER, AORTIC VALVE   . DIABETES MELLITUS, TYPE II   . VITAMIN D DEFICIENCY   . ANEMIA-NOS   . DEGENERATIVE JOINT DISEASE, GENERALIZED   . KNEE PAIN, ACUTE   . INFECTION DUE TO INTERNAL JOINT PROSTHESIS   . Atrial fibrillation   . Hypercholesterolemia   . History of knee replacement     BILATERALLY  . Aortic stenosis, moderate     MOD-SEVERE [name]  . Diabetic eye exam     11/18/2013 NO DIABETIC  . Retinopathy     MATURING CATARACTS [name]  . Kidney disease, chronic, stage III (GFR 30-59 ml/min)     [facility]    Current Outpatient Prescriptions  Medication Sig Dispense Refill  . atorvastatin (LIPITOR) 20 MG tablet Take 20 mg by mouth at bedtime.      . Calcium Carbonate Antacid 600 MG chewable tablet Chew  600 mg by mouth 2 (two) times daily.      . Cholecalciferol (VITAMIN D3) 2000 UNITS TABS Take 2,000 Units by mouth daily.      Marland Kitchen diltiazem (CARTIA XT) 240 MG 24 hr capsule 1 tablet by mouth daily  90 capsule  3  . losartan (COZAAR) 100 MG tablet Take 50 mg by mouth daily.       . multivitamin-iron-minerals-folic acid (CENTRUM) chewable tablet Chew 1 tablet by mouth daily.      . Omega 3 1000 MG CAPS Take 1,000 mg by mouth daily.      . Rivaroxaban (XARELTO) 20 MG TABS tablet Take 1 tablet (20 mg total) by mouth daily.  90 tablet  3  . Saxagliptin-Metformin (KOMBIGLYZE XR) 2.04-999 MG TB24 Take 1 tablet by mouth 2 (two) times daily.       No current facility-administered medications for this visit.    Allergies:   No Known Allergies  Social History:  The patient  reports that he quit smoking about 52 years ago. His smoking use included Cigarettes. He smoked 2.00 packs per day. He does not have any smokeless tobacco history on file. He reports that he  drinks alcohol. He reports that he does not use illicit drugs.   Family History:  The patient's family history includes Alzheimer's disease in his mother and mother; Breast cancer in his mother and another family member; Cancer in his mother; Diabetes in his father, mother, and another family member; Heart attack in his father and another family member; Heart disease in his father.   ROS:  Please see the history of present illness.     All other systems reviewed and negative.   PHYSICAL EXAM: VS:  BP 122/70  Pulse 110  Ht 6\' 1"  (1.854 m)  Wt 249 lb (112.946 kg)  BMI 32.86 kg/m2 Well nourished, well developed, in no acute distress HEENT: normal Neck: no JVD, no carotid bruits Cardiac:  normal S1, S2; irregular; 3/6 systolic murmur Lungs:  clear to auscultation bilaterally, no wheezing, rhonchi or rales Abd: soft, nontender, no hepatomegaly Ext: no edema Skin: warm and dry Neuro:   no focal abnormalities noted  EKG:   AFib,  RVR  ASSESSMENT AND PLAN:   Atrial fibrillation  Increased Cartia XT Capsule Extended Release 24 Hour, 240 MG, 1 capsule, Orally, Once a day, 90 days, 90, Refills 3 at last visit in 12/14. Maintained NSR since 2013, Jan.  Back in Afib since late 2014 by sx; confirmed in Dec 2014. Xarelto for stroke prevention. He has noted some GERD sx.  He walks stairs without problems.  In the past, he was taking his diltiazem, alternating with a lower dose. After starting  the 240 mg daily, HR averages 80-90 by fitbit monitor, which averages entire day's heart rate readings.  Did not need to increase to 360 mg daily. We also discussed adding a beta blocker but did not need to start this. Given that he has not symptomatic with his heart rates, I am reluctant to add an antiarrhythmic.  Fit bit today is underestimating HR by 24 beats per minute.  Plan for 24 hour Holter to see rate control average.  He will lkely need aortic valve replacment at some point.  Could consider Maze procedure at that time.  He does not mind monitoring his heart rhythm at this time.  He has no sx; he knows that his HR increases with activity, up to 150.      2. Hypercholesteremia, pure   Refill Atorvastatin Calcium Tablet, 20 MG, 1 tablet, Orally, Once a day, 90 days, 90, Refills 3 LDL 77. LDL 43 in 11/14.   Fish oil for elevated TG.  Will get most recent from PMD.  5/15: Chol 117; HDL 34; TG 157; LDL 51.   3. Obesity, unspecified  Successful weight loss.     4. Hypertension, essential  Continue Losartan Potassium Tablet, 100 MG, 1/2 tab, Orally, Once a day Controlled.   5.  Aortic stenosis: Moderate to severe by prior echo. Normal LV function.  Last checked in 5/15.  Went over sx to watch for.   Preventive Medicine  Adult topics discussed:  Diet: healthy diet.  Exercise: 5 days a week, at least 30 minutes of aerobic exercise.    Follow Up  6 months (Reason: AFib)     Signed, James Marble, MD, [facility] 10/20/2014 8:46  AM

## 2014-10-20 NOTE — Patient Instructions (Signed)
Your physician has recommended that you wear a 24 hour holter monitor. Holter monitors are medical devices that record the heart's electrical activity. Doctors most often use these monitors to diagnose arrhythmias. Arrhythmias are problems with the speed or rhythm of the heartbeat. The monitor is a small, portable device. You can wear one while you do your normal daily activities. This is usually used to diagnose what is causing palpitations/syncope (passing out).  Your physician wants you to follow-up in: 6 months with Dr. Irish Lack. You will receive a reminder letter in the mail two months in advance. If you don't receive a letter, please call our office to schedule the follow-up appointment.  Your physician recommends that you continue on your current medications as directed. Please refer to the Current Medication list given to you today.

## 2014-10-26 ENCOUNTER — Encounter: Payer: Self-pay | Admitting: *Deleted

## 2014-10-26 ENCOUNTER — Encounter (INDEPENDENT_AMBULATORY_CARE_PROVIDER_SITE_OTHER): Payer: Medicare Other

## 2014-10-26 DIAGNOSIS — I480 Paroxysmal atrial fibrillation: Secondary | ICD-10-CM

## 2014-10-26 NOTE — Progress Notes (Signed)
Patient ID: [name], male   DOB: October 01, 1946, 68 y.o.   MRN: [id number] [employer] 24 hour holter monitor applied patient.

## 2014-11-02 ENCOUNTER — Encounter: Payer: Self-pay | Admitting: Endocrinology

## 2014-11-08 ENCOUNTER — Telehealth: Payer: Self-pay | Admitting: Interventional Cardiology

## 2014-11-08 NOTE — Telephone Encounter (Signed)
The patient had sent a MyChart message requesting his holter results. This was reviewed by Dr. Irish Lack- " Average HR- 87 bpm. HR gets up to 164 bpm with walking. Low HR- 51 bpm- during sleep." No recommendations or changes were made by Dr. Irish Lack. I sent the patient reply through MyChart with his holter readings per Dr. Irish Lack.

## 2014-12-10 ENCOUNTER — Encounter: Payer: Self-pay | Admitting: Podiatrist

## 2014-12-10 ENCOUNTER — Ambulatory Visit (INDEPENDENT_AMBULATORY_CARE_PROVIDER_SITE_OTHER): Payer: Medicare Other | Admitting: Podiatrist

## 2014-12-10 VITALS — BP 127/79 | HR 64 | Resp 16

## 2014-12-10 DIAGNOSIS — L03039 Cellulitis of unspecified toe: Secondary | ICD-10-CM

## 2014-12-10 DIAGNOSIS — L03011 Cellulitis of right finger: Secondary | ICD-10-CM

## 2014-12-10 NOTE — Patient Instructions (Signed)

## 2014-12-10 NOTE — Progress Notes (Signed)
   Subjective:    Patient ID: [name], male    DOB: [birth year], 68 y.o.   MRN: [id number]  HPI Comments: Right great toenail infected, it has been about a week, was put on a antibiotic . i am diabetic 6.6 was the last a1c . i am on a blood thinner      Review of Systems  Endocrine:       Diabetes   All other systems reviewed and are negative.      Objective:   Physical Exam  Patient is awake, alert, and oriented x 3.  In no acute distress.  Vascular status is intact with palpable pedal pulses at 2/4 DP and PT bilateral and capillary refill time within normal limits. Neurological sensation is also intact bilaterally via Semmes Weinstein monofilament at 5/5 sites. Light touch, vibratory sensation, Achilles tendon reflex is intact. Dermatological exam reveals skin color, turger and texture as normal. No open lesions present.  Musculature intact with dorsiflexion, plantarflexion, inversion, eversion.  Right great toenail is infected there is some redness and swelling present especially in the localized digit itself. Mild malodor is also noted. No streaking or lymphangitis is noted. Patient relates it has improved with antibiotics.     Assessment & Plan:  Paronychia right great toenail   Plan: Incision and drainage is performed in order to allow drainage. This was performed under sterile technique and the patient tolerated the procedure well. He was given instructions for aftercare and will finish the antibiotic. He will be seen back for recheck if the toe does not improve within 24 hours.

## 2014-12-22 NOTE — Telephone Encounter (Signed)
error 

## 2014-12-23 ENCOUNTER — Ambulatory Visit (INDEPENDENT_AMBULATORY_CARE_PROVIDER_SITE_OTHER): Payer: Medicare Other | Admitting: Podiatrist

## 2014-12-23 DIAGNOSIS — L03011 Cellulitis of right finger: Secondary | ICD-10-CM

## 2014-12-23 NOTE — Progress Notes (Signed)
Patient presents today for follow-up of incision and drainage right great toe. He states doing well and he is having no trouble. He states that right after the procedures performed it became a little bit red however it subsided. Denies any systemic or local signs of infection.  Objective: Neurovascular status unchanged. Excellent appearance of the right great toe is seen. No redness, no swelling, no streaking, no drainage is noted.  Assessment: Status post incision and drainage right hallux nail  Plan: Recommended leaving the toe open to air at night and covering during the day he'll be seen back as needed for follow-up if any concerns arise he will call.

## 2015-01-21 ENCOUNTER — Encounter: Payer: Self-pay | Admitting: Gastroenterology

## 2015-01-21 ENCOUNTER — Other Ambulatory Visit: Payer: Self-pay | Admitting: Family Medicine

## 2015-01-21 DIAGNOSIS — Z87891 Personal history of nicotine dependence: Secondary | ICD-10-CM

## 2015-01-26 ENCOUNTER — Ambulatory Visit
Admission: RE | Admit: 2015-01-26 | Discharge: 2015-01-26 | Disposition: A | Discharge status: Home / Self Care | Payer: Medicare Other | Source: Ambulatory Visit | Attending: Family Medicine | Admitting: Family Medicine

## 2015-01-26 DIAGNOSIS — Z87891 Personal history of nicotine dependence: Secondary | ICD-10-CM

## 2015-01-28 ENCOUNTER — Ambulatory Visit: Payer: Medicare Other | Admitting: Gastroenterology

## 2015-01-28 ENCOUNTER — Telehealth: Payer: Self-pay | Admitting: *Deleted

## 2015-01-28 ENCOUNTER — Encounter: Payer: Self-pay | Admitting: Physician Assistant

## 2015-01-28 ENCOUNTER — Ambulatory Visit (INDEPENDENT_AMBULATORY_CARE_PROVIDER_SITE_OTHER): Payer: Medicare Other | Admitting: Physician Assistant

## 2015-01-28 VITALS — BP 116/70 | HR 57 | Ht 72.5 in | Wt 251.0 lb

## 2015-01-28 DIAGNOSIS — Z86010 Personal history of colonic polyps: Secondary | ICD-10-CM

## 2015-01-28 DIAGNOSIS — Z7901 Long term (current) use of anticoagulants: Secondary | ICD-10-CM

## 2015-01-28 MED ORDER — MOVIPREP 100 G PO SOLR: 1.0000 | Freq: Once | ORAL | Status: DC

## 2015-01-28 NOTE — Progress Notes (Signed)
Patient ID: [name], male   DOB: October 04, 1946, 69 y.o.   MRN: [id number]    HPI:   [name] is a 69 year old male with a history of colon polyps. He states that he had a colonoscopy 20-25 years ago in [place] at which time a polyp was removed. His last surveillance colonoscopy was done by Dr. Deatra Ina in 2005. No polyps were removed, and the patient was advised to have surveillance in 10 years. He denies any change in his bowel habits or stool caliber. He has not had any bloody or tarry stools. He denies any anorexia or unexplained weight loss.  He has a history of hyperlipidemia, hypertension, diabetes, vitamin D deficiency, degenerative joint disease, atrial fibrillation on Xarelto, hypercholesterolemia, and moderate aortic stenosis. His cardiologist is Dr. Irish Lack.   Past Medical History  Diagnosis Date  . Other and unspecified hyperlipidemia   . HYPERTENSION   . DISORDER, AORTIC VALVE   . DIABETES MELLITUS, TYPE II   . VITAMIN D DEFICIENCY   . ANEMIA-NOS   . DEGENERATIVE JOINT DISEASE, GENERALIZED   . KNEE PAIN, ACUTE   . INFECTION DUE TO INTERNAL JOINT PROSTHESIS   . Atrial fibrillation   . Hypercholesterolemia   . History of knee replacement     BILATERALLY  . Aortic stenosis, moderate     MOD-SEVERE DR. VARANASI  . Diabetic eye exam     11/18/2013 NO DIABETIC  . Retinopathy     MATURING CATARACTS DR. SALLY MILLER  . Kidney disease, chronic, stage III (GFR 30-59 ml/min)     [facility]    Past Surgical History  Procedure Laterality Date  . Cardioversion  01/11/2012    Procedure: CARDIOVERSION;  Surgeon: Jettie Booze, MD;  Location: [facility];  Service: Cardiovascular;  Laterality: N/A;  . Total knee arthroplasty      RT X2 /LEFT DR. OLIN  . Dental surgery      implants X2  04/2011   Family History  Problem Relation Age of Onset  . Heart attack    . Diabetes    . Breast cancer    . Alzheimer's disease Mother   . Breast cancer Mother   . Alzheimer's disease  Mother   . Diabetes Mother   . Heart attack Father   . Heart disease Father   . Diabetes Father   . Cancer Mother    History  Substance Use Topics  . Smoking status: Former Smoker -- 2.00 packs/day    Types: Cigarettes    Quit date: 01/17/1962  . Smokeless tobacco: Not on file  . Alcohol Use: Yes     Comment: one glass of wine once a month on average   Current Outpatient Prescriptions  Medication Sig Dispense Refill  . atorvastatin (LIPITOR) 20 MG tablet Take 20 mg by mouth at bedtime.    . Calcium Carbonate Antacid 600 MG chewable tablet Chew 600 mg by mouth 2 (two) times daily.    . Cholecalciferol (VITAMIN D3) 2000 UNITS TABS Take 2,000 Units by mouth daily.    Marland Kitchen diltiazem (CARDIZEM CD) 240 MG 24 hr capsule 1 tablet by mouth daily 90 capsule 3  . losartan (COZAAR) 100 MG tablet Take 50 mg by mouth daily.     . multivitamin-iron-minerals-folic acid (CENTRUM) chewable tablet Chew 1 tablet by mouth daily.    . Omega 3 1000 MG CAPS Take 1,000 mg by mouth daily.    . rivaroxaban (XARELTO) 20 MG TABS tablet Take 1 tablet (20 mg total)  by mouth daily. 90 tablet 3  . Saxagliptin-Metformin (KOMBIGLYZE XR) 2.04-999 MG TB24 Take 1 tablet by mouth 2 (two) times daily.     No current facility-administered medications for this visit.   No Known Allergies   Review of Systems: Gen: Denies any fever, chills, sweats, anorexia, fatigue, weakness, malaise, weight loss, and sleep disorder CV: Denies chest pain, angina, palpitations, syncope, orthopnea, PND, peripheral edema, and claudication. Resp: Denies dyspnea at rest, dyspnea with exercise, cough, sputum, wheezing, coughing up blood, and pleurisy. GI: Denies vomiting blood, jaundice, and fecal incontinence.   Denies dysphagia or odynophagia. GU : Denies urinary burning, blood in urine, urinary frequency, urinary hesitancy, nocturnal urination, and urinary incontinence. MS: Denies joint pain, limitation of movement, and swelling, stiffness,  low back pain, extremity pain. Denies muscle weakness, cramps, atrophy.  Derm: Denies rash, itching, dry skin, hives, moles, warts, or unhealing ulcers.  Psych: Denies depression, anxiety, memory loss, suicidal ideation, hallucinations, paranoia, and confusion. Heme: Denies bruising, bleeding, and enlarged lymph nodes. Neuro:  Denies any headaches, dizziness, paresthesias. Endo:  Denies any problems with DM, thyroid, adrenal function  Studies: [place] Screening Aaa  01/26/2015   CLINICAL DATA:  Medicare screening exam for abdominal aortic aneurysm.  EXAM: ABDOMINAL AORTA SCREENING ULTRASOUND  TECHNIQUE: Ultrasound examination of the abdominal aorta was performed as a screening evaluation for abdominal aortic aneurysm.  COMPARISON:  None.  FINDINGS: Abdominal Aorta  Mild abdominal aortic ectasia.  Maximum AP  Diameter:  2.7 cm  Maximum TRV  Diameter: 2.9 cm  IMPRESSION: Mild abdominal aortic ectasia . Ectatic abdominal aorta at risk for aneurysm development. Recommend followup by ultrasound in 5 years. This recommendation follows ACR consensus guidelines: White Paper of the ACR Incidental Findings Committee II on Vascular Findings. J Am Coll Radiol 2013; 10:789-794.   Electronically Signed   By: [place]   On: 01/26/2015 11:11      Prior Endoscopies:   Colonoscopy on 08/31/2004 showed no polyps.  Physical Exam: BP 116/70 mmHg  Pulse 57  Ht 6' 0.5" (1.842 m)  Wt 251 lb (113.853 kg)  BMI 33.56 kg/m2  SpO2 98% Constitutional: Pleasant,well-developed male in no acute distress. HEENT: Normocephalic and atraumatic. Conjunctivae are normal. No scleral icterus. Neck supple. No thyromegaly Cardiovascular: irreg rate, 3/6 systolic murmur Pulmonary/chest: Effort normal and breath sounds normal. No wheezing, rales or rhonchi. Abdominal: Soft, nondistended, nontender. Bowel sounds active throughout. There are no masses palpable. No hepatomegaly. Extremities: no edema Lymphadenopathy: No cervical  adenopathy noted. Neurological: Alert and oriented to person place and time. Skin: Skin is warm and dry. No rashes noted. Psychiatric: Normal mood and affect. Behavior is normal.  ASSESSMENT AND PLAN: 69 year old male with a personal history of adenomatous polyps due for surveillance colonoscopy to evaluate for recurrent polyps or neoplasia.The risks, benefits, and alternatives to colonoscopy with possible biopsy and possible polypectomy were discussed with the patient and they consent to proceed. The risk of holding anticoagulation therapy or antiplatelet medications was discussed including the increased risk for thromboembolic disease that may include DVT, pulmonary emboli and stroke. The patient understands this risk and is willing to proceed with temporally holding the medication provided that this is approved by her PCP or cardiologist. The procedure will be scheduled with Dr. Deatra Ina.    Maaz Spiering, Vita Barley PA-C 01/28/2015, 3:51 PM

## 2015-01-28 NOTE — Patient Instructions (Signed)
You have been scheduled for a colonoscopy. Please follow written instructions given to you at your visit today.  Please pick up your prep kit at the pharmacy within the next 1-3 days. If you use inhalers (even only as needed), please bring them with you on the day of your procedure. Your physician has requested that you go to www.startemmi.com and enter the access code given to you at your visit today. This web site gives a general overview about your procedure. However, you should still follow specific instructions given to you by our office regarding your preparation for the procedure.  You will be contaced by our office prior to your procedure for directions on holding your xarelto.  If you do not hear from our office 1 week prior to your scheduled procedure, please call [phone] to discuss.  CC:Dr Candace Tamala Julian

## 2015-01-28 NOTE — Telephone Encounter (Signed)
01/28/2015 RE: FADY STAMPS DOB: 06-14-1946 MRN: [id number]  Dear Dr Irish Lack,   We have scheduled the above patient for a colonoscopy. Our records show that he is on anticoagulation therapy.  Please advise as whether the patient may come off his therapy of xarelto 2 days prior to the procedure, which is scheduled for 03/03/15. Please route your response to Dixon Boos, CMA.  Sincerely,  Dixon Boos

## 2015-01-28 NOTE — Telephone Encounter (Signed)
Patient called back to state that Moviprep would cost $120 at the pharmacy. He would like Miralax prep instead. New prep mailed to patient. He is to call back to discuss instructions.

## 2015-01-28 NOTE — Telephone Encounter (Signed)
Left message for patient to call back. When he calls back, I will also advise him to hold Kombiglyze day of procedure (diabetic medication).

## 2015-01-28 NOTE — Telephone Encounter (Signed)
OK to hold Xarelto 2 days for colonoscopy.

## 2015-01-28 NOTE — Telephone Encounter (Signed)
I have spoken to patient to advise of Dr Hassell Done recommendations and to hold Kombiglyze the day of procedure. He verbalizes understanding.

## 2015-01-31 NOTE — Progress Notes (Signed)
Reviewed and agree with management. Slate Debroux D. Sharline Lehane, M.D., FACG  

## 2015-02-04 ENCOUNTER — Telehealth: Payer: Self-pay | Admitting: Gastroenterology

## 2015-02-04 NOTE — Telephone Encounter (Signed)
Left message for patient to call back  

## 2015-02-04 NOTE — Telephone Encounter (Signed)
Patient verbalizes understanding of miralax instructions sent via mail.

## 2015-03-03 ENCOUNTER — Ambulatory Visit (AMBULATORY_SURGERY_CENTER): Payer: Medicare Other | Admitting: Gastroenterology

## 2015-03-03 ENCOUNTER — Encounter: Payer: Self-pay | Admitting: Gastroenterology

## 2015-03-03 VITALS — BP 100/69 | HR 82 | Temp 96.4°F | Resp 19 | Ht 72.0 in | Wt 251.0 lb

## 2015-03-03 DIAGNOSIS — D122 Benign neoplasm of ascending colon: Secondary | ICD-10-CM | POA: Diagnosis not present

## 2015-03-03 DIAGNOSIS — Z86010 Personal history of colonic polyps: Secondary | ICD-10-CM | POA: Diagnosis not present

## 2015-03-03 LAB — GLUCOSE, CAPILLARY
Glucose-Capillary: 95 mg/dL (ref 70–99)
Glucose-Capillary: 85 mg/dL (ref 70–99)

## 2015-03-03 MED ORDER — SODIUM CHLORIDE 0.9 % IV SOLN: 500.0000 mL | INTRAVENOUS | Status: DC

## 2015-03-03 NOTE — Op Note (Signed)
[place]  [address]. [place], [address]   COLONOSCOPY PROCEDURE REPORT  PATIENT: [name], [name]  MR#: [id number] BIRTHDATE: 1946/06/27 , 68  yrs. old GENDER: male ENDOSCOPIST: Inda Castle, MD REFERRED HD:[id number] Tamala Julian, M.D. PROCEDURE DATE:  03/03/2015 PROCEDURE:   Colonoscopy, screening and Colonoscopy with snare polypectomy First Screening Colonoscopy - Avg.  risk and is 50 yrs.  old or older - No.  Prior Negative Screening - Now for repeat screening. 10 or more years since last screening  History of Adenoma - Now for follow-up colonoscopy & has been > or = to 3 yrs.  Yes hx of adenoma.  Has been 3 or more years since last colonoscopy. ASA CLASS:   Class II INDICATIONS:PH Colon Adenoma. MEDICATIONS: Monitored anesthesia care, Propofol 300 mg IV, and Lidocaine 40 IV  DESCRIPTION OF PROCEDURE:   After the risks benefits and alternatives of the procedure were thoroughly explained, informed consent was obtained.  The digital rectal exam revealed no abnormalities of the rectum.   The LB [id number]  endoscope was introduced through the anus and advanced to the cecum, which was identified by both the appendix and ileocecal valve. No adverse events experienced.   The quality of the prep was (Suprep was used) fair.  The instrument was then slowly withdrawn as the colon was fully examined.      COLON FINDINGS: A sessile polyp measuring 3 mm in size was found in the ascending colon.  A polypectomy was performed with a cold snare.  The resection was complete, the polyp tissue was completely retrieved and sent to histology.   The examination was otherwise normal.  Retroflexed views revealed no abnormalities. The time to cecum = 4.1 Withdrawal time = 9.2   The scope was withdrawn and the procedure completed. COMPLICATIONS: There were no immediate complications.  ENDOSCOPIC IMPRESSION: 1.   Sessile polyp was found in the ascending colon;  polypectomy was performed with a cold snare 2.   The examination was otherwise normal  RECOMMENDATIONS: 1.  If the polyp(s) removed today are proven to be adenomatous (pre-cancerous) polyps, you will need a repeat colonoscopy in 5 years.  Otherwise you should continue to follow colorectal cancer screening guidelines for "routine risk" patients with colonoscopy in 10 years.  You will receive a letter within 1-2 weeks with the results of your biopsy as well as final recommendations.  Please call my office if you have not received a letter after 3 weeks. 2.  Rresume Xarelto in 48 hours  eSigned:  Inda Castle, MD 03/03/2015 4:15 PM   cc:   PATIENT NAME:  [name], [name] MR#: [id number]

## 2015-03-03 NOTE — Progress Notes (Signed)
Stable to RR 

## 2015-03-03 NOTE — Patient Instructions (Addendum)
Resume Xarelto in 48 hours  INFORMATION ON POLYPS GIVEN TO YOU TODAY  YOU HAD AN ENDOSCOPIC PROCEDURE TODAY AT [facility]:   Refer to the procedure report that was given to you for any specific questions about what was found during the examination.  If the procedure report does not answer your questions, please call your gastroenterologist to clarify.  If you requested that your care partner not be given the details of your procedure findings, then the procedure report has been included in a sealed envelope for you to review at your convenience later.  YOU SHOULD EXPECT: Some feelings of bloating in the abdomen. Passage of more gas than usual.  Walking can help get rid of the air that was put into your GI tract during the procedure and reduce the bloating. If you had a lower endoscopy (such as a colonoscopy or flexible sigmoidoscopy) you may notice spotting of blood in your stool or on the toilet paper. If you underwent a bowel prep for your procedure, you may not have a normal bowel movement for a few days.  Please Note:  You might notice some irritation and congestion in your nose or some drainage.  This is from the oxygen used during your procedure.  There is no need for concern and it should clear up in a day or so.  SYMPTOMS TO REPORT IMMEDIATELY:   Following lower endoscopy (colonoscopy or flexible sigmoidoscopy):  Excessive amounts of blood in the stool  Significant tenderness or worsening of abdominal pains  Swelling of the abdomen that is new, acute  Fever of 100F or higher    For urgent or emergent issues, a gastroenterologist can be reached at any hour by calling [phone].   DIET: Your first meal following the procedure should be a small meal and then it is ok to progress to your normal diet. Heavy or fried foods are harder to digest and may make you feel nauseous or bloated.  Likewise, meals heavy in dairy and vegetables can increase bloating.  Drink  plenty of fluids but you should avoid alcoholic beverages for 24 hours.  ACTIVITY:  You should plan to take it easy for the rest of today and you should NOT DRIVE or use heavy machinery until tomorrow (because of the sedation medicines used during the test).    FOLLOW UP: Our staff will call the number listed on your records the next business day following your procedure to check on you and address any questions or concerns that you may have regarding the information given to you following your procedure. If we do not reach you, we will leave a message.  However, if you are feeling well and you are not experiencing any problems, there is no need to return our call.  We will assume that you have returned to your regular daily activities without incident.  If any biopsies were taken you will be contacted by phone or by letter within the next 1-3 weeks.  Please call us at [phone] if you have not heard about the biopsies in 3 weeks.    SIGNATURES/CONFIDENTIALITY: You and/or your care partner have signed paperwork which will be entered into your electronic medical record.  These signatures attest to the fact that that the information above on your After Visit Summary has been reviewed and is understood.  Full responsibility of the confidentiality of this discharge information lies with you and/or your care-partner.

## 2015-03-03 NOTE — Progress Notes (Signed)
Called to room to assist during endoscopic procedure.  Patient ID and intended procedure confirmed with present staff. Received instructions for my participation in the procedure from the performing physician.  

## 2015-03-04 ENCOUNTER — Telehealth: Payer: Self-pay | Admitting: *Deleted

## 2015-03-04 NOTE — Telephone Encounter (Signed)
  Follow up Call-  Call back number 03/03/2015  Post procedure Call Back phone  # [phone] hm  Permission to leave phone message Yes     Patient questions:  Do you have a fever, pain , or abdominal swelling? No. Pain Score  0 *  Have you tolerated food without any problems? Yes.    Have you been able to return to your normal activities? Yes.    Do you have any questions about your discharge instructions: Diet   No. Medications  No. Follow up visit  Yes.    Do you have questions or concerns about your Care? No.  Actions: * If pain score is 4 or above: No action needed, pain <4.

## 2015-03-09 ENCOUNTER — Encounter: Payer: Self-pay | Admitting: Gastroenterology

## 2015-05-02 ENCOUNTER — Ambulatory Visit (INDEPENDENT_AMBULATORY_CARE_PROVIDER_SITE_OTHER): Payer: Medicare Other | Admitting: Interventional Cardiology

## 2015-05-02 ENCOUNTER — Encounter: Payer: Self-pay | Admitting: Interventional Cardiology

## 2015-05-02 VITALS — BP 132/68 | HR 92 | Ht 72.0 in | Wt 244.6 lb

## 2015-05-02 DIAGNOSIS — I10 Essential (primary) hypertension: Secondary | ICD-10-CM | POA: Diagnosis not present

## 2015-05-02 DIAGNOSIS — E663 Overweight: Secondary | ICD-10-CM | POA: Diagnosis not present

## 2015-05-02 DIAGNOSIS — I359 Nonrheumatic aortic valve disorder, unspecified: Secondary | ICD-10-CM

## 2015-05-02 DIAGNOSIS — I480 Paroxysmal atrial fibrillation: Secondary | ICD-10-CM | POA: Diagnosis not present

## 2015-05-02 MED ORDER — RIVAROXABAN 20 MG PO TABS: 20.0000 mg | ORAL_TABLET | Freq: Every day | ORAL | Status: DC

## 2015-05-02 MED ORDER — DILTIAZEM HCL ER COATED BEADS 240 MG PO CP24: ORAL_CAPSULE | ORAL | Status: DC

## 2015-05-02 MED ORDER — ATORVASTATIN CALCIUM 20 MG PO TABS: 20.0000 mg | ORAL_TABLET | Freq: Every day | ORAL | Status: AC

## 2015-05-02 NOTE — Patient Instructions (Signed)
Medication Instructions:  Same  Labwork: None  Testing/Procedures: None  Follow-Up: Your physician wants you to follow-up in: 6 months. You will receive a reminder letter in the mail two months in advance. If you don't receive a letter, please call our office to schedule the follow-up appointment.

## 2015-05-02 NOTE — Progress Notes (Signed)
Patient ID: [name], male   DOB: [birth year], 69 y.o.   MRN: [id number] Patient ID: [name], male   DOB: 1946-11-20, 69 y.o.   MRN: [id number]      [place], [address] [name], [place]  [address] Phone: [phone] Fax:  [phone]  Date:  05/02/2015   ID:  [name], DOB 09-Mar-1946, MRN [id number]  PCP:  Reginia Naas, MD      History of Present Illness: [name] is a 69 y.o. male who had AFib. He had a DCCV in 12/2011.  No chest pain. He is back to exercising with a trainer.  He has lost 27 lbs. He uses the bike and treadmill at a gym. No CP, SHOB with exercise. He has been travelling to [place] to see his grandkids.   Atrial Fibrillation F/U:  Denies : Chest pain.  Dizziness.  Leg edema.  Orthopnea.  Palpitations.  Syncope.  No sx associated with aortic stenosis.   HR at rest is in the 80s.  At peak exercise, it can go to the 120s.  No SHOB, lightheadedness with exercise.  Weight loss since retirement in 6/15; not eating out as much.  Blood sugar has improved.   Wt Readings from Last 3 Encounters:  05/02/15 244 lb 9.6 oz (110.95 kg)  03/03/15 251 lb (113.853 kg)  01/28/15 251 lb (113.853 kg)     Past Medical History  Diagnosis Date  . Other and unspecified hyperlipidemia   . HYPERTENSION   . DISORDER, AORTIC VALVE   . DIABETES MELLITUS, TYPE II   . VITAMIN D DEFICIENCY   . DEGENERATIVE JOINT DISEASE, GENERALIZED   . KNEE PAIN, ACUTE   . INFECTION DUE TO INTERNAL JOINT PROSTHESIS   . Atrial fibrillation   . Hypercholesterolemia   . History of knee replacement     BILATERALLY  . Aortic stenosis, moderate     MOD-SEVERE DR. VARANASI  . Diabetic eye exam     11/18/2013 NO DIABETIC  . Retinopathy     MATURING CATARACTS DR. SALLY MILLER  . Kidney disease, chronic, stage III (GFR 30-59 ml/min)     [facility]  . ANEMIA-NOS     pt denies  . Blood transfusion without reported diagnosis   . Heart murmur     Current  Outpatient Prescriptions  Medication Sig Dispense Refill  . atorvastatin (LIPITOR) 20 MG tablet Take 20 mg by mouth at bedtime.    . Calcium Carbonate Antacid 600 MG chewable tablet Chew 600 mg by mouth 2 (two) times daily.    . Cholecalciferol (VITAMIN D3) 2000 UNITS TABS Take 2,000 Units by mouth daily.    Marland Kitchen diltiazem (CARDIZEM CD) 240 MG 24 hr capsule 1 tablet by mouth daily 90 capsule 3  . losartan (COZAAR) 100 MG tablet Take 50 mg by mouth daily.     . multivitamin-iron-minerals-folic acid (CENTRUM) chewable tablet Chew 1 tablet by mouth daily.    . Omega 3 1000 MG CAPS Take 1,000 mg by mouth daily.    . rivaroxaban (XARELTO) 20 MG TABS tablet Take 1 tablet (20 mg total) by mouth daily. 90 tablet 3  . Saxagliptin-Metformin (KOMBIGLYZE XR) 2.04-999 MG TB24 Take 1 tablet by mouth 2 (two) times daily.     No current facility-administered medications for this visit.    Allergies:   No Known Allergies  Social History:  The patient  reports that he quit smoking about 53 years ago. His smoking use  included Cigarettes. He smoked 1.00 pack per day. He does not have any smokeless tobacco history on file. He reports that he drinks alcohol. He reports that he does not use illicit drugs.   Family History:  The patient's family history includes Alzheimer's disease in his mother; Breast cancer in his mother and paternal aunt; Cancer in his mother; Diabetes in his father, maternal grandmother, and mother; Heart attack in his father; Heart disease in his father. There is no history of Colon cancer, Esophageal cancer, Rectal cancer, or Stomach cancer.   ROS:  Please see the history of present illness.     All other systems reviewed and negative.   PHYSICAL EXAM: VS:  BP 132/68 mmHg  Pulse 92  Ht 6' (1.829 m)  Wt 244 lb 9.6 oz (110.95 kg)  BMI 33.17 kg/m2  SpO2 99% Well nourished, well developed, in no acute distress HEENT: normal Neck: no JVD, no carotid bruits Cardiac:  normal S1, S2; irregular;  3/6 systolic murmur Lungs:  clear to auscultation bilaterally, no wheezing, rhonchi or rales Abd: soft, nontender, no hepatomegaly Ext: no edema Skin: warm and dry Neuro:   no focal abnormalities noted Psych: normal affect  EKG:   AFib, RVR  ASSESSMENT AND PLAN:   Atrial fibrillation  Increased Cartia XT Capsule Extended Release 24 Hour, 240 MG, 1 capsule, Orally, Once a day, 90 days, 90, Refills 3 at last visit in 12/14. Maintained NSR since 2013, Jan.  Back in Afib since late 2014 by sx; confirmed in Dec 2014. Xarelto for stroke prevention.  GERD sx resolved since changing diet.  He walks stairs and exercises without problems.  In the past, he was taking his diltiazem, alternating with a lower dose. After starting  the 240 mg daily, HR averages 80-90 by fitbit monitor, which averages entire day's heart rate readings.  24 hour Holter to see rate control average showed average HR of 87 in November 2015.  He will likely need aortic valve replacment at some point.  Could consider Maze procedure at that time.  He does not mind monitoring his heart rhythm at this time.  He has no sx; he knows that his HR increases with activity, up to 150, by Holter monitor.      2. Hypercholesteremia, pure   Refill Atorvastatin Calcium Tablet, 20 MG, 1 tablet, Orally, Once a day, 90 days, 90, Refills 3 LDL 77. LDL 43 in 11/14.   Fish oil for elevated TG.  Will get most recent from PMD.  5/15: Chol 117; HDL 34; TG 157; LDL 51.   3. Obesity, unspecified  Successful weight loss.  Overall, feels much better.   4. Hypertension, essential  Continue Losartan Potassium Tablet, 100 MG, 1/2 tab, Orally, Once a day Controlled.   5.  Aortic stenosis: Moderate to severe by prior echo. Normal LV function.  Last checked in 5/15.  Went over sx to watch for.  Will not repeat echo at this time since he is not having symptoms.  Preventive Medicine  Adult topics discussed:  Diet: healthy diet.  Exercise: 5 days a  week, at least 30 minutes of aerobic exercise.    Follow Up  6 months (Reason: AFib)     Signed, Mina Marble, MD, [facility] 05/02/2015 4:01 PM

## 2015-06-20 ENCOUNTER — Other Ambulatory Visit: Payer: Self-pay

## 2015-07-18 ENCOUNTER — Encounter: Payer: Self-pay | Admitting: Interventional Cardiology

## 2015-07-25 ENCOUNTER — Telehealth: Payer: Self-pay | Admitting: *Deleted

## 2015-07-25 ENCOUNTER — Encounter: Payer: Self-pay | Admitting: Podiatry

## 2015-07-25 ENCOUNTER — Ambulatory Visit (INDEPENDENT_AMBULATORY_CARE_PROVIDER_SITE_OTHER): Payer: Medicare Other | Admitting: Podiatry

## 2015-07-25 VITALS — BP 123/60 | HR 85 | Resp 15

## 2015-07-25 DIAGNOSIS — L600 Ingrowing nail: Secondary | ICD-10-CM

## 2015-07-25 NOTE — Patient Instructions (Signed)

## 2015-07-25 NOTE — Telephone Encounter (Signed)
Pt states the toe that had the ingrown procedure this morning, has bleed through the dressing, and has continued to bleed through the reinforcing dressing he applied.  I told the pt to leave the surgical dressing in place, may reinforce the dressing again, and elevate the foot.  Pt states he had been weight bearing, before the bleeding began.  I encouraged pt to elevate and rest the foot, and said he could come in to be evaluated if he needed.

## 2015-07-26 ENCOUNTER — Telehealth: Payer: Self-pay | Admitting: *Deleted

## 2015-07-26 NOTE — Telephone Encounter (Signed)
Called patient at [phone] (cell #) to check to see how they were feeling from their ingrown toenail procedure that was done on 07/25/15. Patient stated, "they were doing well, took a shower and soaked toe". Pt also stated, "they had no pain". When I asked about how they did during the night, patient stated, they were up from 4 am to 8 am with some pain".

## 2015-08-23 ENCOUNTER — Encounter: Payer: Self-pay | Admitting: Interventional Cardiology

## 2015-08-24 ENCOUNTER — Telehealth: Payer: Self-pay | Admitting: *Deleted

## 2015-08-24 DIAGNOSIS — I480 Paroxysmal atrial fibrillation: Secondary | ICD-10-CM

## 2015-08-24 MED ORDER — RIVAROXABAN 20 MG PO TABS: 20.0000 mg | ORAL_TABLET | Freq: Every day | ORAL | Status: AC

## 2015-08-24 MED ORDER — DILTIAZEM HCL ER COATED BEADS 240 MG PO CP24: ORAL_CAPSULE | ORAL | Status: AC

## 2015-08-24 NOTE — Telephone Encounter (Signed)
-----   Message -----      From: [name]     Sent: 08/23/2015  9:31 AM      To: [facility] Triage    Subject: Non-Urgent Medical Question                 I need help with two issues. We have moved to [facility], [place].        1. I need refills for both CARTIA XT 240/24, and XARELTO 20MG . These are both 90 day supply.     Our new pharmacy is:        [facility].        [place], [place] [address]            [phone]        2. I need a referral of a doctor as good as DR. [place], either in [place], [place] or [place], [place]        Thank you for everything, [name]    Will forward to Dr. Irish Lack for recommendation on Cardiologist in [place], [place] or [place], [place]. Medications refilled and sent to preferred pharmacy.

## 2015-08-24 NOTE — Telephone Encounter (Signed)
OK to refill.  I don't know anyone in [facility].  I know Dr. Rushie Chestnut at [facility] in [place].  He is very good.

## 2015-08-24 NOTE — Telephone Encounter (Signed)
Sent message to pt through MyChart, as this is how he has been communicating, with information given by Dr. Irish Lack.

## 2015-09-13 IMAGING — US US AORTA SCREENING (MEDICARE)
1 series · 11 of 11 positions shown · non-contrast
Comparison: None.

CLINICAL DATA: Medicare screening exam for abdominal aortic
aneurysm.

EXAM:
ABDOMINAL AORTA SCREENING ULTRASOUND
TECHNIQUE: Ultrasound examination of the abdominal aorta was performed as a
screening evaluation for abdominal aortic aneurysm.

[Series 1: us aorta screening (medicare) · 0.35mm/px · 11 of 11 slices shown]
[im 1/11]
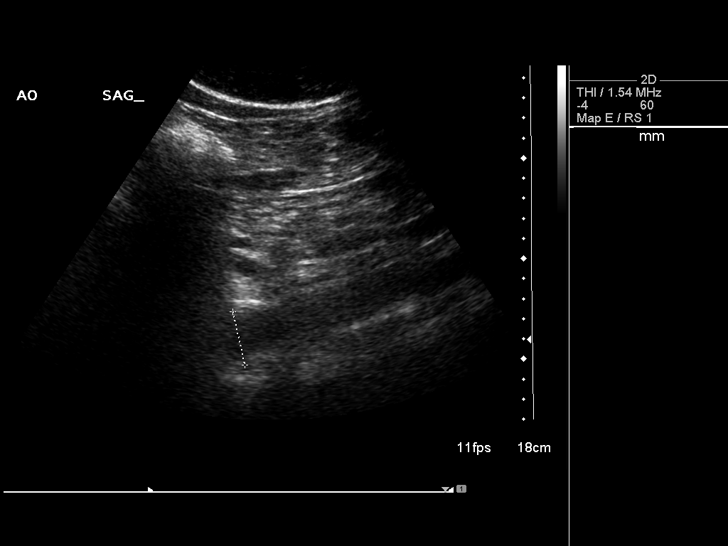
[im 2/11]
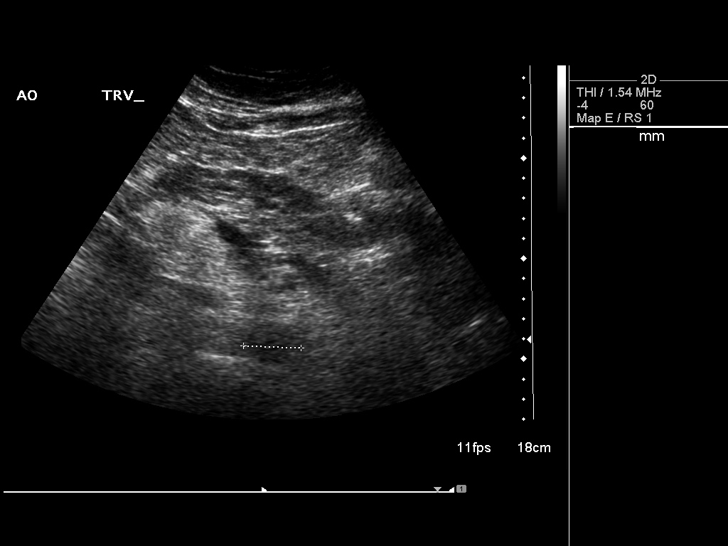
[im 3/11]
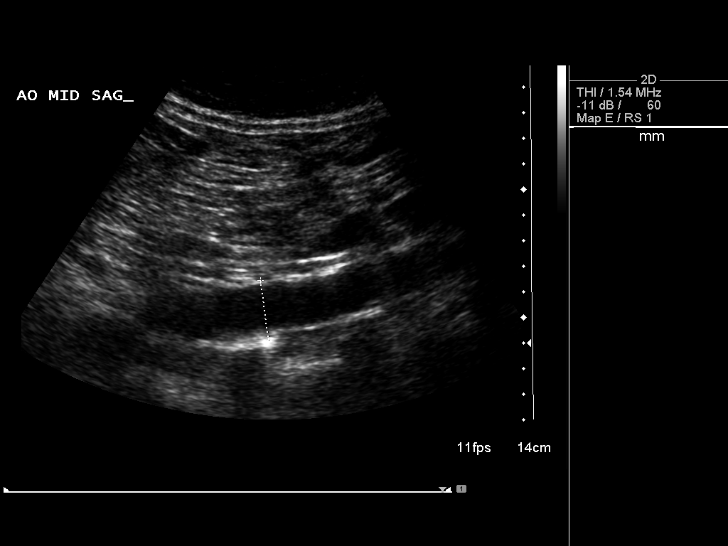
[im 4/11]
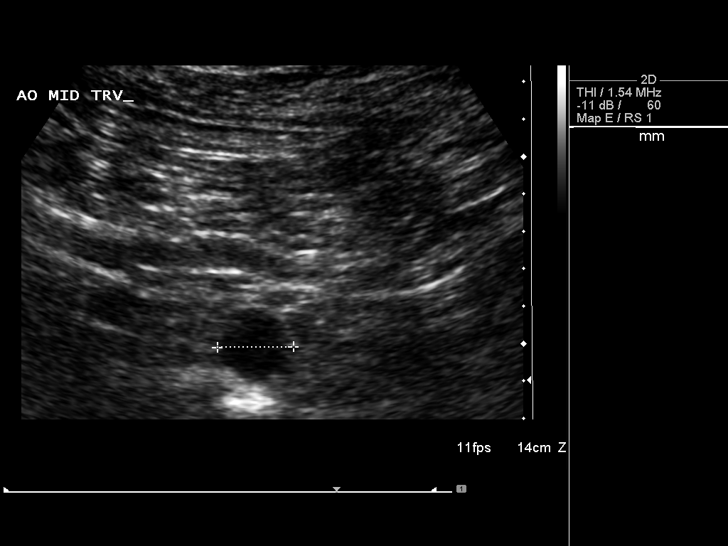
[im 5/11]
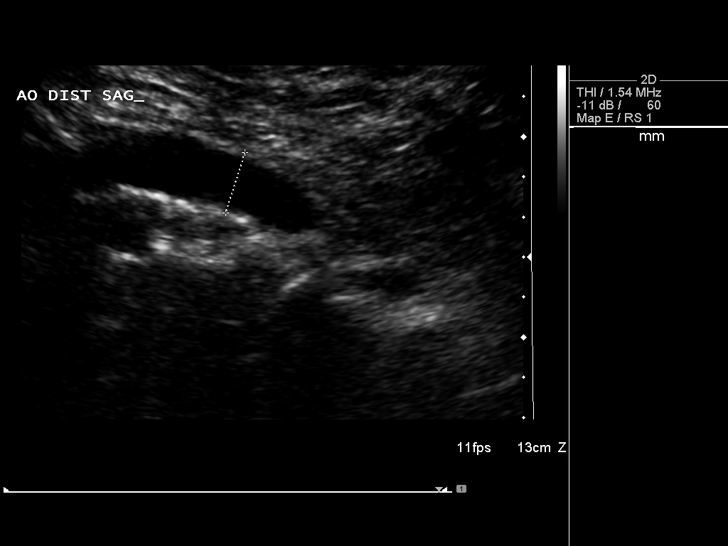
[im 6/11]
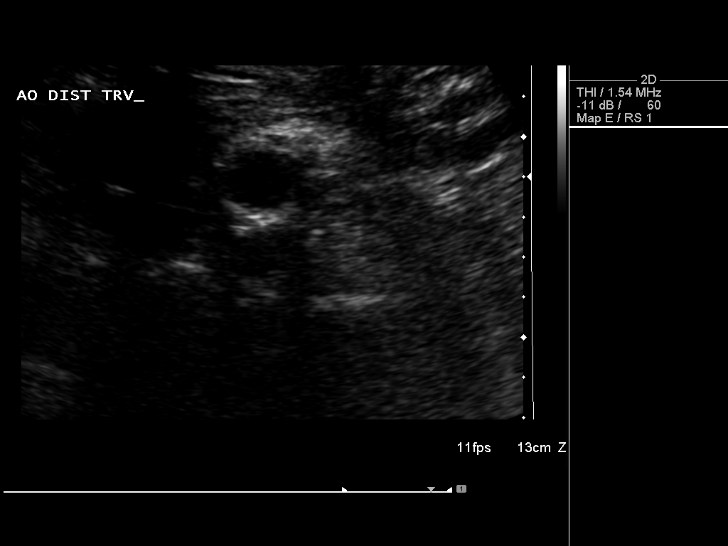
[im 7/11]
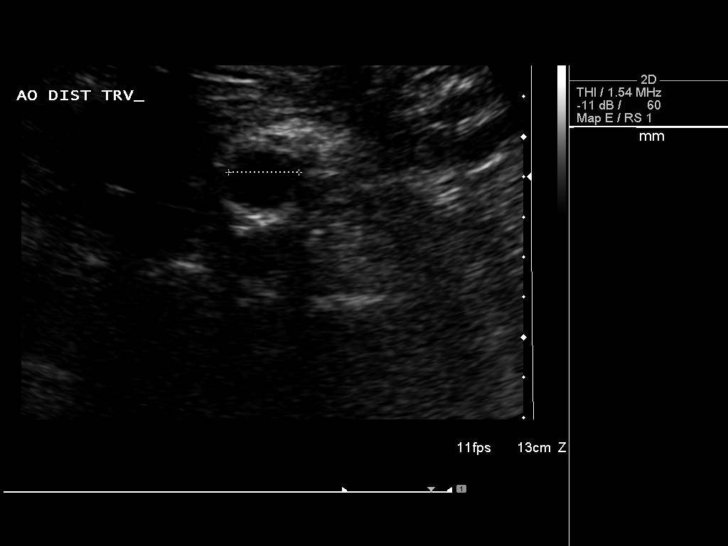
[im 8/11]
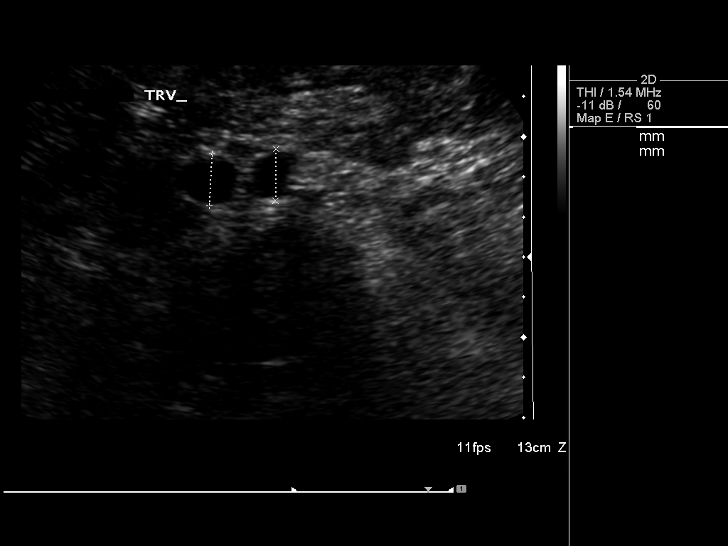
[im 9/11]
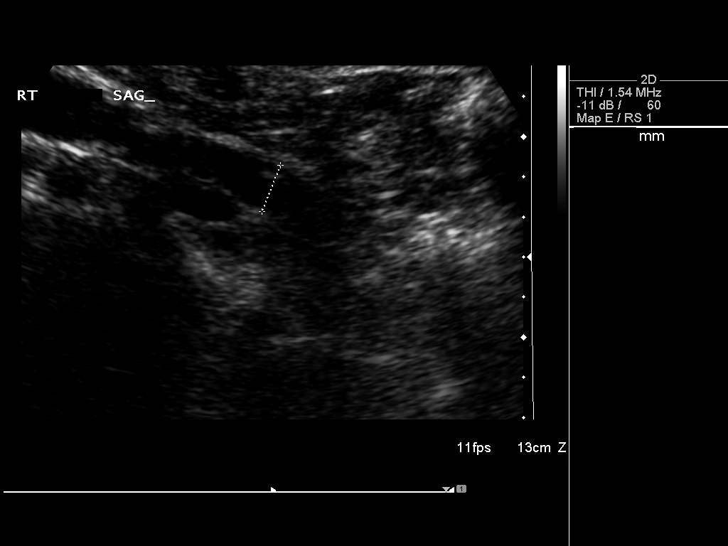
[im 10/11]
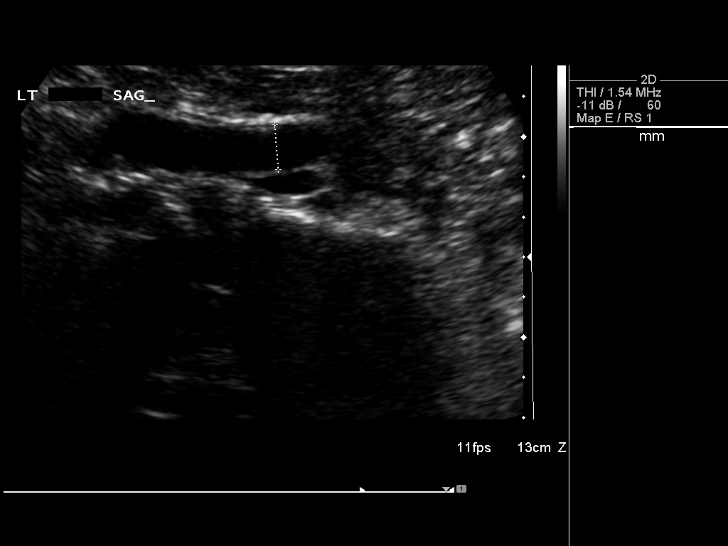
[im 11/11]
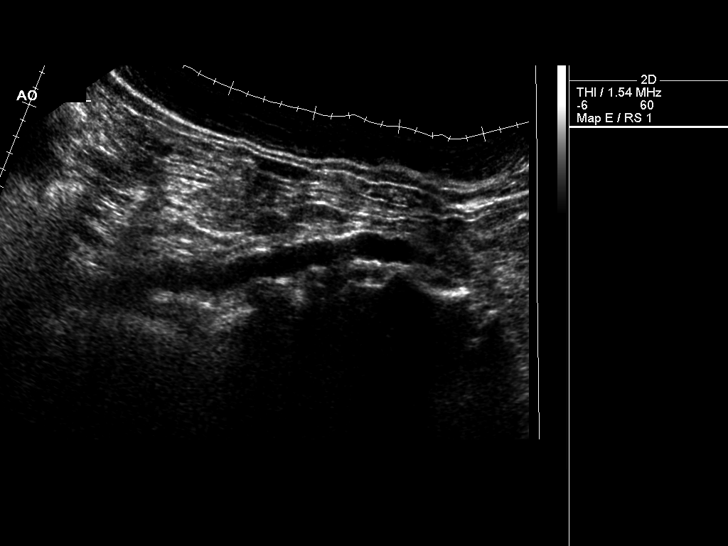

[11 of 11 positions shown; findings below may reference images not displayed]

FINDINGS: Abdominal Aorta

Mild abdominal aortic ectasia.

Maximum AP

Diameter:  2.7 cm

Maximum TRV

Diameter: 2.9 cm
IMPRESSION: Mild abdominal aortic ectasia . Ectatic abdominal aorta at risk for
aneurysm development. Recommend followup by ultrasound in 5 years.
This recommendation follows ACR consensus guidelines: White Paper of
the ACR Incidental Findings Committee II on Vascular Findings. [HOSPITAL] 1965; [DATE].

## 2015-09-23 NOTE — Progress Notes (Signed)
   Subjective:    Patient ID: [name], male    DOB: Dec 27, 1945, 69 y.o.   MRN: [id number]  HPI    Review of Systems     Objective:   Physical Exam        Assessment & Plan:

## 2015-10-18 ENCOUNTER — Encounter: Payer: Self-pay | Admitting: Interventional Cardiology
